# Patient Record
Sex: Female | Born: 1950 | Race: White | Hispanic: No | Marital: Married | Smoking: Never smoker
Health system: Southern US, Community
[De-identification: ages and names within clinical notes are randomized; demographics above are authoritative.]

## PROBLEM LIST (undated history)

## (undated) DIAGNOSIS — I10 Essential (primary) hypertension: Secondary | ICD-10-CM

## (undated) DIAGNOSIS — E119 Type 2 diabetes mellitus without complications: Secondary | ICD-10-CM

## (undated) DIAGNOSIS — M1990 Unspecified osteoarthritis, unspecified site: Secondary | ICD-10-CM

## (undated) HISTORY — DX: Type 2 diabetes mellitus without complications: E11.9

## (undated) HISTORY — DX: Unspecified osteoarthritis, unspecified site: M19.90

## (undated) HISTORY — PX: APPENDECTOMY: SHX54

## (undated) HISTORY — DX: Essential (primary) hypertension: I10

---

## 1999-07-06 ENCOUNTER — Encounter: Admission: RE | Admit: 1999-07-06 | Discharge: 1999-07-06 | Payer: Self-pay | Admitting: Emergency Medicine

## 1999-07-06 ENCOUNTER — Encounter: Payer: Self-pay | Admitting: Emergency Medicine

## 1999-12-14 ENCOUNTER — Other Ambulatory Visit: Admission: RE | Admit: 1999-12-14 | Discharge: 1999-12-14 | Payer: Self-pay | Admitting: Emergency Medicine

## 2001-09-15 ENCOUNTER — Encounter: Admission: RE | Admit: 2001-09-15 | Discharge: 2001-09-15 | Payer: Self-pay | Admitting: Emergency Medicine

## 2001-09-15 ENCOUNTER — Encounter: Payer: Self-pay | Admitting: Emergency Medicine

## 2001-10-19 ENCOUNTER — Encounter: Payer: Self-pay | Admitting: Emergency Medicine

## 2001-10-19 ENCOUNTER — Encounter: Admission: RE | Admit: 2001-10-19 | Discharge: 2001-10-19 | Payer: Self-pay | Admitting: Emergency Medicine

## 2001-12-08 ENCOUNTER — Encounter: Payer: Self-pay | Admitting: Emergency Medicine

## 2001-12-08 ENCOUNTER — Encounter: Admission: RE | Admit: 2001-12-08 | Discharge: 2001-12-08 | Payer: Self-pay | Admitting: Emergency Medicine

## 2002-02-26 ENCOUNTER — Encounter: Admission: RE | Admit: 2002-02-26 | Discharge: 2002-02-26 | Payer: Self-pay | Admitting: Emergency Medicine

## 2002-02-26 ENCOUNTER — Encounter: Payer: Self-pay | Admitting: Emergency Medicine

## 2004-02-26 ENCOUNTER — Encounter: Admission: RE | Admit: 2004-02-26 | Discharge: 2004-02-26 | Payer: Self-pay | Admitting: Emergency Medicine

## 2004-10-05 ENCOUNTER — Encounter: Admission: RE | Admit: 2004-10-05 | Discharge: 2004-10-05 | Payer: Self-pay | Admitting: Emergency Medicine

## 2004-10-05 ENCOUNTER — Inpatient Hospital Stay (HOSPITAL_COMMUNITY): Admission: EM | Admit: 2004-10-05 | Discharge: 2004-10-08 | Payer: Self-pay | Admitting: Emergency Medicine

## 2006-04-04 ENCOUNTER — Encounter: Admission: RE | Admit: 2006-04-04 | Discharge: 2006-04-04 | Payer: Self-pay | Admitting: Emergency Medicine

## 2010-03-21 ENCOUNTER — Encounter: Payer: Self-pay | Admitting: Emergency Medicine

## 2016-04-05 DIAGNOSIS — H5201 Hypermetropia, right eye: Secondary | ICD-10-CM | POA: Diagnosis not present

## 2016-04-05 DIAGNOSIS — H524 Presbyopia: Secondary | ICD-10-CM | POA: Diagnosis not present

## 2016-04-05 DIAGNOSIS — H52223 Regular astigmatism, bilateral: Secondary | ICD-10-CM | POA: Diagnosis not present

## 2016-04-05 DIAGNOSIS — H5212 Myopia, left eye: Secondary | ICD-10-CM | POA: Diagnosis not present

## 2016-04-06 DIAGNOSIS — E782 Mixed hyperlipidemia: Secondary | ICD-10-CM | POA: Diagnosis not present

## 2016-04-06 DIAGNOSIS — E119 Type 2 diabetes mellitus without complications: Secondary | ICD-10-CM | POA: Diagnosis not present

## 2016-04-09 DIAGNOSIS — I10 Essential (primary) hypertension: Secondary | ICD-10-CM | POA: Diagnosis not present

## 2016-04-09 DIAGNOSIS — E782 Mixed hyperlipidemia: Secondary | ICD-10-CM | POA: Diagnosis not present

## 2016-04-09 DIAGNOSIS — E119 Type 2 diabetes mellitus without complications: Secondary | ICD-10-CM | POA: Diagnosis not present

## 2016-04-09 DIAGNOSIS — Z6823 Body mass index (BMI) 23.0-23.9, adult: Secondary | ICD-10-CM | POA: Diagnosis not present

## 2016-06-07 DIAGNOSIS — Z6823 Body mass index (BMI) 23.0-23.9, adult: Secondary | ICD-10-CM | POA: Diagnosis not present

## 2016-06-07 DIAGNOSIS — E782 Mixed hyperlipidemia: Secondary | ICD-10-CM | POA: Diagnosis not present

## 2016-06-07 DIAGNOSIS — I10 Essential (primary) hypertension: Secondary | ICD-10-CM | POA: Diagnosis not present

## 2016-06-07 DIAGNOSIS — E119 Type 2 diabetes mellitus without complications: Secondary | ICD-10-CM | POA: Diagnosis not present

## 2016-07-05 DIAGNOSIS — E119 Type 2 diabetes mellitus without complications: Secondary | ICD-10-CM | POA: Diagnosis not present

## 2016-07-05 DIAGNOSIS — E782 Mixed hyperlipidemia: Secondary | ICD-10-CM | POA: Diagnosis not present

## 2016-07-05 DIAGNOSIS — I10 Essential (primary) hypertension: Secondary | ICD-10-CM | POA: Diagnosis not present

## 2016-07-07 DIAGNOSIS — Z23 Encounter for immunization: Secondary | ICD-10-CM | POA: Diagnosis not present

## 2016-07-07 DIAGNOSIS — E782 Mixed hyperlipidemia: Secondary | ICD-10-CM | POA: Diagnosis not present

## 2016-07-07 DIAGNOSIS — Z6823 Body mass index (BMI) 23.0-23.9, adult: Secondary | ICD-10-CM | POA: Diagnosis not present

## 2016-07-07 DIAGNOSIS — I10 Essential (primary) hypertension: Secondary | ICD-10-CM | POA: Diagnosis not present

## 2016-07-07 DIAGNOSIS — E119 Type 2 diabetes mellitus without complications: Secondary | ICD-10-CM | POA: Diagnosis not present

## 2016-10-06 DIAGNOSIS — E119 Type 2 diabetes mellitus without complications: Secondary | ICD-10-CM | POA: Diagnosis not present

## 2016-10-06 DIAGNOSIS — I10 Essential (primary) hypertension: Secondary | ICD-10-CM | POA: Diagnosis not present

## 2016-10-13 DIAGNOSIS — E119 Type 2 diabetes mellitus without complications: Secondary | ICD-10-CM | POA: Diagnosis not present

## 2016-10-13 DIAGNOSIS — I10 Essential (primary) hypertension: Secondary | ICD-10-CM | POA: Diagnosis not present

## 2016-10-13 DIAGNOSIS — Z6823 Body mass index (BMI) 23.0-23.9, adult: Secondary | ICD-10-CM | POA: Diagnosis not present

## 2016-10-13 DIAGNOSIS — E782 Mixed hyperlipidemia: Secondary | ICD-10-CM | POA: Diagnosis not present

## 2017-01-12 DIAGNOSIS — I10 Essential (primary) hypertension: Secondary | ICD-10-CM | POA: Diagnosis not present

## 2017-01-12 DIAGNOSIS — E119 Type 2 diabetes mellitus without complications: Secondary | ICD-10-CM | POA: Diagnosis not present

## 2017-01-12 DIAGNOSIS — E782 Mixed hyperlipidemia: Secondary | ICD-10-CM | POA: Diagnosis not present

## 2017-01-19 DIAGNOSIS — I10 Essential (primary) hypertension: Secondary | ICD-10-CM | POA: Diagnosis not present

## 2017-04-06 DIAGNOSIS — E119 Type 2 diabetes mellitus without complications: Secondary | ICD-10-CM | POA: Diagnosis not present

## 2017-04-11 DIAGNOSIS — E782 Mixed hyperlipidemia: Secondary | ICD-10-CM | POA: Diagnosis not present

## 2017-04-11 DIAGNOSIS — E1165 Type 2 diabetes mellitus with hyperglycemia: Secondary | ICD-10-CM | POA: Diagnosis not present

## 2017-04-11 DIAGNOSIS — I10 Essential (primary) hypertension: Secondary | ICD-10-CM | POA: Diagnosis not present

## 2017-04-13 DIAGNOSIS — I10 Essential (primary) hypertension: Secondary | ICD-10-CM | POA: Diagnosis not present

## 2017-07-04 DIAGNOSIS — E1165 Type 2 diabetes mellitus with hyperglycemia: Secondary | ICD-10-CM | POA: Diagnosis not present

## 2017-07-04 DIAGNOSIS — E782 Mixed hyperlipidemia: Secondary | ICD-10-CM | POA: Diagnosis not present

## 2017-07-06 DIAGNOSIS — I10 Essential (primary) hypertension: Secondary | ICD-10-CM | POA: Diagnosis not present

## 2017-07-06 DIAGNOSIS — E1165 Type 2 diabetes mellitus with hyperglycemia: Secondary | ICD-10-CM | POA: Diagnosis not present

## 2017-07-06 DIAGNOSIS — E782 Mixed hyperlipidemia: Secondary | ICD-10-CM | POA: Diagnosis not present

## 2017-07-06 DIAGNOSIS — M25512 Pain in left shoulder: Secondary | ICD-10-CM | POA: Diagnosis not present

## 2017-07-19 ENCOUNTER — Ambulatory Visit: Payer: Medicare Other | Admitting: Orthopaedic Surgery

## 2017-07-19 ENCOUNTER — Encounter: Payer: Self-pay | Admitting: Orthopaedic Surgery

## 2017-07-19 ENCOUNTER — Ambulatory Visit (INDEPENDENT_AMBULATORY_CARE_PROVIDER_SITE_OTHER): Payer: Medicare Other

## 2017-07-19 VITALS — BP 143/69 | HR 84 | Ht 60.0 in | Wt 133.0 lb

## 2017-07-19 DIAGNOSIS — M25512 Pain in left shoulder: Secondary | ICD-10-CM | POA: Diagnosis not present

## 2017-07-19 DIAGNOSIS — G5601 Carpal tunnel syndrome, right upper limb: Secondary | ICD-10-CM | POA: Diagnosis not present

## 2017-07-19 NOTE — Patient Instructions (Signed)
Shoulder Exercises Ask your health care provider which exercises are safe for you. Do exercises exactly as told by your health care provider and adjust them as directed. It is normal to feel mild stretching, pulling, tightness, or discomfort as you do these exercises, but you should stop right away if you feel sudden pain or your pain gets worse.Do not begin these exercises until told by your health care provider. RANGE OF MOTION EXERCISES These exercises warm up your muscles and joints and improve the movement and flexibility of your shoulder. These exercises also help to relieve pain, numbness, and tingling. These exercises involve stretching your injured shoulder directly. Exercise A: Pendulum  1. Stand near a wall or a surface that you can hold onto for balance. 2. Bend at the waist and let your left / right arm hang straight down. Use your other arm to support you. Keep your back straight and do not lock your knees. 3. Relax your left / right arm and shoulder muscles, and move your hips and your trunk so your left / right arm swings freely. Your arm should swing because of the motion of your body, not because you are using your arm or shoulder muscles. 4. Keep moving your body so your arm swings in the following directions, as told by your health care provider: ? Side to side. ? Forward and backward. ? In clockwise and counterclockwise circles. 5. Continue each motion for __________ seconds, or for as Laurie Lamb as told by your health care provider. 6. Slowly return to the starting position. Repeat __________ times. Complete this exercise __________ times a day. Exercise B:Flexion, Standing  1. Stand and hold a broomstick, a cane, or a similar object. Place your hands a little more than shoulder-width apart on the object. Your left / right hand should be palm-up, and your other hand should be palm-down. 2. Keep your elbow straight and keep your shoulder muscles relaxed. Push the stick down with  your healthy arm to raise your left / right arm in front of your body, and then over your head until you feel a stretch in your shoulder. ? Avoid shrugging your shoulder while you raise your arm. Keep your shoulder blade tucked down toward the middle of your back. 3. Hold for __________ seconds. 4. Slowly return to the starting position. Repeat __________ times. Complete this exercise __________ times a day. Exercise C: Abduction, Standing 1. Stand and hold a broomstick, a cane, or a similar object. Place your hands a little more than shoulder-width apart on the object. Your left / right hand should be palm-up, and your other hand should be palm-down. 2. While keeping your elbow straight and your shoulder muscles relaxed, push the stick across your body toward your left / right side. Raise your left / right arm to the side of your body and then over your head until you feel a stretch in your shoulder. ? Do not raise your arm above shoulder height, unless your health care provider tells you to do that. ? Avoid shrugging your shoulder while you raise your arm. Keep your shoulder blade tucked down toward the middle of your back. 3. Hold for __________ seconds. 4. Slowly return to the starting position. Repeat __________ times. Complete this exercise __________ times a day. Exercise D:Internal Rotation  1. Place your left / right hand behind your back, palm-up. 2. Use your other hand to dangle an exercise band, a towel, or a similar object over your shoulder. Grasp the band with   your left / right hand so you are holding onto both ends. 3. Gently pull up on the band until you feel a stretch in the front of your left / right shoulder. ? Avoid shrugging your shoulder while you raise your arm. Keep your shoulder blade tucked down toward the middle of your back. 4. Hold for __________ seconds. 5. Release the stretch by letting go of the band and lowering your hands. Repeat __________ times. Complete  this exercise __________ times a day. STRETCHING EXERCISES These exercises warm up your muscles and joints and improve the movement and flexibility of your shoulder. These exercises also help to relieve pain, numbness, and tingling. These exercises are done using your healthy shoulder to help stretch the muscles of your injured shoulder. Exercise E: Corner Stretch (External Rotation and Abduction)  1. Stand in a doorway with one of your feet slightly in front of the other. This is called a staggered stance. If you cannot reach your forearms to the door frame, stand facing a corner of a room. 2. Choose one of the following positions as told by your health care provider: ? Place your hands and forearms on the door frame above your head. ? Place your hands and forearms on the door frame at the height of your head. ? Place your hands on the door frame at the height of your elbows. 3. Slowly move your weight onto your front foot until you feel a stretch across your chest and in the front of your shoulders. Keep your head and chest upright and keep your abdominal muscles tight. 4. Hold for __________ seconds. 5. To release the stretch, shift your weight to your back foot. Repeat __________ times. Complete this stretch __________ times a day. Exercise F:Extension, Standing 1. Stand and hold a broomstick, a cane, or a similar object behind your back. ? Your hands should be a little wider than shoulder-width apart. ? Your palms should face away from your back. 2. Keeping your elbows straight and keeping your shoulder muscles relaxed, move the stick away from your body until you feel a stretch in your shoulder. ? Avoid shrugging your shoulders while you move the stick. Keep your shoulder blade tucked down toward the middle of your back. 3. Hold for __________ seconds. 4. Slowly return to the starting position. Repeat __________ times. Complete this exercise __________ times a day. STRENGTHENING  EXERCISES These exercises build strength and endurance in your shoulder. Endurance is the ability to use your muscles for a Laurie Lamb time, even after they get tired. Exercise G:External Rotation  1. Sit in a stable chair without armrests. 2. Secure an exercise band at elbow height on your left / right side. 3. Place a soft object, such as a folded towel or a small pillow, between your left / right upper arm and your body to move your elbow a few inches away (about 10 cm) from your side. 4. Hold the end of the band so it is tight and there is no slack. 5. Keeping your elbow pressed against the soft object, move your left / right forearm out, away from your abdomen. Keep your body steady so only your forearm moves. 6. Hold for __________ seconds. 7. Slowly return to the starting position. Repeat __________ times. Complete this exercise __________ times a day. Exercise H:Shoulder Abduction  1. Sit in a stable chair without armrests, or stand. 2. Hold a __________ weight in your left / right hand, or hold an exercise band with both hands.   3. Start with your arms straight down and your left / right palm facing in, toward your body. 4. Slowly lift your left / right hand out to your side. Do not lift your hand above shoulder height unless your health care provider tells you that this is safe. ? Keep your arms straight. ? Avoid shrugging your shoulder while you do this movement. Keep your shoulder blade tucked down toward the middle of your back. 5. Hold for __________ seconds. 6. Slowly lower your arm, and return to the starting position. Repeat __________ times. Complete this exercise __________ times a day. Exercise I:Shoulder Extension 1. Sit in a stable chair without armrests, or stand. 2. Secure an exercise band to a stable object in front of you where it is at shoulder height. 3. Hold one end of the exercise band in each hand. Your palms should face each other. 4. Straighten your elbows and  lift your hands up to shoulder height. 5. Step back, away from the secured end of the exercise band, until the band is tight and there is no slack. 6. Squeeze your shoulder blades together as you pull your hands down to the sides of your thighs. Stop when your hands are straight down by your sides. Do not let your hands go behind your body. 7. Hold for __________ seconds. 8. Slowly return to the starting position. Repeat __________ times. Complete this exercise __________ times a day. Exercise J:Standing Shoulder Row 1. Sit in a stable chair without armrests, or stand. 2. Secure an exercise band to a stable object in front of you so it is at waist height. 3. Hold one end of the exercise band in each hand. Your palms should be in a thumbs-up position. 4. Bend each of your elbows to an "L" shape (about 90 degrees) and keep your upper arms at your sides. 5. Step back until the band is tight and there is no slack. 6. Slowly pull your elbows back behind you. 7. Hold for __________ seconds. 8. Slowly return to the starting position. Repeat __________ times. Complete this exercise __________ times a day. Exercise K:Shoulder Press-Ups  1. Sit in a stable chair that has armrests. Sit upright, with your feet flat on the floor. 2. Put your hands on the armrests so your elbows are bent and your fingers are pointing forward. Your hands should be about even with the sides of your body. 3. Push down on the armrests and use your arms to lift yourself off of the chair. Straighten your elbows and lift yourself up as much as you comfortably can. ? Move your shoulder blades down, and avoid letting your shoulders move up toward your ears. ? Keep your feet on the ground. As you get stronger, your feet should support less of your body weight as you lift yourself up. 4. Hold for __________ seconds. 5. Slowly lower yourself back into the chair. Repeat __________ times. Complete this exercise __________ times a  day. Exercise L: Wall Push-Ups  1. Stand so you are facing a stable wall. Your feet should be about one arm-length away from the wall. 2. Lean forward and place your palms on the wall at shoulder height. 3. Keep your feet flat on the floor as you bend your elbows and lean forward toward the wall. 4. Hold for __________ seconds. 5. Straighten your elbows to push yourself back to the starting position. Repeat __________ times. Complete this exercise __________ times a day. This information is not intended to replace advice   given to you by your health care provider. Make sure you discuss any questions you have with your health care provider. Document Released: 12/30/2004 Document Revised: 11/10/2015 Document Reviewed: 10/27/2014 Elsevier Interactive Patient Education  2018 [employer]  

## 2017-07-19 NOTE — Progress Notes (Signed)
Subjective: My left shoulder hurts    Patient ID: Laurie Lamb, female    DOB: 1950/05/25, 67 y.o.   MRN: [id number]  HPI She has about a four month history of increasing pain of the left shoulder. She has no falls or trauma.  She has no redness.  She has noticed it is more difficult to comb her hair or move her arm above her head. She has no numbness of the left hand but has some nocturnal pain of the right hand. She has no neck pain.  She has taken one Advil a day for this with only minimal results.  She has tried heat and ice and rubs with no help.  She saw Dr. Margo Aye for this and I have read his notes.   Review of Systems  Respiratory: Negative for cough and shortness of breath.   Cardiovascular: Negative for chest pain and leg swelling.  Musculoskeletal: Positive for arthralgias.  All other systems reviewed and are negative.  Past Medical History:  Diagnosis Date  . Arthritis   . Diabetes mellitus without complication (HCC)   . Hypertension     Past Surgical History:  Procedure Laterality Date  . APPENDECTOMY    . CESAREAN SECTION      Current Outpatient Medications on File Prior to Visit  Medication Sig Dispense Refill  . amLODipine (NORVASC) 5 MG tablet Take 5 mg by mouth daily.    . metFORMIN (GLUCOPHAGE) 500 MG tablet Take by mouth 2 (two) times daily with a meal.    . valsartan-hydrochlorothiazide (DIOVAN-HCT) 160-12.5 MG tablet Take 1 tablet by mouth daily.     No current facility-administered medications on file prior to visit.     Social History   Socioeconomic History  . Marital status: Married    Spouse name: Not on file  . Number of children: Not on file  . Years of education: Not on file  . Highest education level: Not on file  Occupational History  . Not on file  Social Needs  . Financial resource strain: Not on file  . Food insecurity:    Worry: Not on file    Inability: Not on file  . Transportation needs:    Medical: Not on file   Non-medical: Not on file  Tobacco Use  . Smoking status: Never Smoker  . Smokeless tobacco: Never Used  Substance and Sexual Activity  . Alcohol use: Never    Frequency: Never  . Drug use: Never  . Sexual activity: Not on file  Lifestyle  . Physical activity:    Days per week: Not on file    Minutes per session: Not on file  . Stress: Not on file  Relationships  . Social connections:    Talks on phone: Not on file    Gets together: Not on file    Attends religious service: Not on file    Active member of club or organization: Not on file    Attends meetings of clubs or organizations: Not on file    Relationship status: Not on file  . Intimate partner violence:    Fear of current or ex partner: Not on file    Emotionally abused: Not on file    Physically abused: Not on file    Forced sexual activity: Not on file  Other Topics Concern  . Not on file  Social History Narrative  . Not on file    Family History  Problem Relation Age of Onset  .  Thyroid disease Sister   . Diabetes Sister   . Graves' disease Sister   . Cancer Brother     BP (!) 143/69   Pulse 84   Ht 5' (1.524 m)   Wt 133 lb (60.3 kg)   BMI 25.97 kg/m        Objective:   Physical Exam  Constitutional: She is oriented to person, place, and time. She appears well-developed and well-nourished.  HENT:  Head: Normocephalic and atraumatic.  Eyes: Pupils are equal, round, and reactive to light. Conjunctivae and EOM are normal.  Neck: Normal range of motion. Neck supple.  Cardiovascular: Normal rate, regular rhythm and intact distal pulses.  Pulmonary/Chest: Effort normal.  Abdominal: Soft.  Musculoskeletal:       Left shoulder: She exhibits decreased range of motion and tenderness.       Arms: Neurological: She is alert and oriented to person, place, and time. She has normal reflexes. She displays normal reflexes. No cranial nerve deficit. She exhibits normal muscle tone. Coordination normal.  Skin:  Skin is warm and dry.  Psychiatric: She has a normal mood and affect. Her behavior is normal. Judgment and thought content normal.  She has weakly positive Phalen on the right and negative Tinel on the right hand,  Both are negative on the left hand.  X-rays of the shoulder on the left was done, reported separately.      Assessment & Plan:   Encounter Diagnoses  Name Primary?  . Pain in joint of left shoulder Yes  . Carpal tunnel syndrome on right    PROCEDURE NOTE:  The patient request injection, verbal consent was obtained.  The left shoulder was prepped appropriately after time out was performed.   Sterile technique was observed and injection of 1 cc of Depo-Medrol 40 mg with several cc's of plain xylocaine. Anesthesia was provided by ethyl chloride and a 20-gauge needle was used to inject the shoulder area. A posterior approach was used.  The injection was tolerated well.  A band aid dressing was applied.  The patient was advised to apply ice later today and tomorrow to the injection sight as needed.  I want her to take two Advil twice a day by mouth.  Return in one month.  Call if any problem.  Precautions discussed.   Electronically Signed Darreld Mclean, MD 5/21/20199:55 AM

## 2017-08-23 ENCOUNTER — Encounter: Payer: Self-pay | Admitting: Orthopaedic Surgery

## 2017-08-23 ENCOUNTER — Ambulatory Visit: Payer: Medicare Other | Admitting: Orthopaedic Surgery

## 2017-08-23 VITALS — BP 134/68 | HR 85 | Ht 60.0 in | Wt 135.0 lb

## 2017-08-23 DIAGNOSIS — M25512 Pain in left shoulder: Secondary | ICD-10-CM

## 2017-08-23 NOTE — Progress Notes (Signed)
CC:  My shoulder is better  She has full motion of the left shoulder today and no pain. The injection last time helped significantly.  NV intact. ROM is full.  Encounter Diagnosis  Name Primary?  . Pain in joint of left shoulder Yes   I will see as needed.  Electronically Signed Darreld McleanWayne Joseline Mccampbell, MD 6/25/20198:28 AM

## 2017-10-06 DIAGNOSIS — E1165 Type 2 diabetes mellitus with hyperglycemia: Secondary | ICD-10-CM | POA: Diagnosis not present

## 2017-10-06 DIAGNOSIS — I10 Essential (primary) hypertension: Secondary | ICD-10-CM | POA: Diagnosis not present

## 2017-10-06 DIAGNOSIS — E782 Mixed hyperlipidemia: Secondary | ICD-10-CM | POA: Diagnosis not present

## 2017-10-11 DIAGNOSIS — I10 Essential (primary) hypertension: Secondary | ICD-10-CM | POA: Diagnosis not present

## 2017-10-11 DIAGNOSIS — E1165 Type 2 diabetes mellitus with hyperglycemia: Secondary | ICD-10-CM | POA: Diagnosis not present

## 2017-10-11 DIAGNOSIS — E782 Mixed hyperlipidemia: Secondary | ICD-10-CM | POA: Diagnosis not present

## 2017-11-22 DIAGNOSIS — Z0000 Encounter for general adult medical examination without abnormal findings: Secondary | ICD-10-CM | POA: Diagnosis not present

## 2018-01-16 DIAGNOSIS — I10 Essential (primary) hypertension: Secondary | ICD-10-CM | POA: Diagnosis not present

## 2018-01-16 DIAGNOSIS — E782 Mixed hyperlipidemia: Secondary | ICD-10-CM | POA: Diagnosis not present

## 2018-01-16 DIAGNOSIS — Z0000 Encounter for general adult medical examination without abnormal findings: Secondary | ICD-10-CM | POA: Diagnosis not present

## 2018-01-16 DIAGNOSIS — E1165 Type 2 diabetes mellitus with hyperglycemia: Secondary | ICD-10-CM | POA: Diagnosis not present

## 2018-01-25 DIAGNOSIS — E0840 Diabetes mellitus due to underlying condition with diabetic neuropathy, unspecified: Secondary | ICD-10-CM | POA: Diagnosis not present

## 2018-01-25 DIAGNOSIS — I10 Essential (primary) hypertension: Secondary | ICD-10-CM | POA: Diagnosis not present

## 2018-01-25 DIAGNOSIS — G9009 Other idiopathic peripheral autonomic neuropathy: Secondary | ICD-10-CM | POA: Diagnosis not present

## 2018-01-25 DIAGNOSIS — E782 Mixed hyperlipidemia: Secondary | ICD-10-CM | POA: Diagnosis not present

## 2018-05-23 DIAGNOSIS — E782 Mixed hyperlipidemia: Secondary | ICD-10-CM | POA: Diagnosis not present

## 2018-05-23 DIAGNOSIS — Z0000 Encounter for general adult medical examination without abnormal findings: Secondary | ICD-10-CM | POA: Diagnosis not present

## 2018-05-23 DIAGNOSIS — E1165 Type 2 diabetes mellitus with hyperglycemia: Secondary | ICD-10-CM | POA: Diagnosis not present

## 2018-05-23 DIAGNOSIS — I10 Essential (primary) hypertension: Secondary | ICD-10-CM | POA: Diagnosis not present

## 2018-05-26 DIAGNOSIS — E782 Mixed hyperlipidemia: Secondary | ICD-10-CM | POA: Diagnosis not present

## 2018-05-26 DIAGNOSIS — M791 Myalgia, unspecified site: Secondary | ICD-10-CM | POA: Diagnosis not present

## 2018-05-26 DIAGNOSIS — I10 Essential (primary) hypertension: Secondary | ICD-10-CM | POA: Diagnosis not present

## 2018-05-26 DIAGNOSIS — E0840 Diabetes mellitus due to underlying condition with diabetic neuropathy, unspecified: Secondary | ICD-10-CM | POA: Diagnosis not present

## 2018-05-26 DIAGNOSIS — G9009 Other idiopathic peripheral autonomic neuropathy: Secondary | ICD-10-CM | POA: Diagnosis not present

## 2018-07-04 DIAGNOSIS — Z0000 Encounter for general adult medical examination without abnormal findings: Secondary | ICD-10-CM | POA: Diagnosis not present

## 2018-08-10 DIAGNOSIS — Z1211 Encounter for screening for malignant neoplasm of colon: Secondary | ICD-10-CM | POA: Diagnosis not present

## 2018-08-10 DIAGNOSIS — Z1212 Encounter for screening for malignant neoplasm of rectum: Secondary | ICD-10-CM | POA: Diagnosis not present

## 2018-08-29 DIAGNOSIS — Z712 Person consulting for explanation of examination or test findings: Secondary | ICD-10-CM | POA: Diagnosis not present

## 2018-08-29 DIAGNOSIS — E782 Mixed hyperlipidemia: Secondary | ICD-10-CM | POA: Diagnosis not present

## 2018-08-29 DIAGNOSIS — E1165 Type 2 diabetes mellitus with hyperglycemia: Secondary | ICD-10-CM | POA: Diagnosis not present

## 2018-08-29 DIAGNOSIS — Z0000 Encounter for general adult medical examination without abnormal findings: Secondary | ICD-10-CM | POA: Diagnosis not present

## 2018-08-29 DIAGNOSIS — I10 Essential (primary) hypertension: Secondary | ICD-10-CM | POA: Diagnosis not present

## 2018-09-05 DIAGNOSIS — M791 Myalgia, unspecified site: Secondary | ICD-10-CM | POA: Diagnosis not present

## 2018-09-05 DIAGNOSIS — I10 Essential (primary) hypertension: Secondary | ICD-10-CM | POA: Diagnosis not present

## 2018-09-05 DIAGNOSIS — G9009 Other idiopathic peripheral autonomic neuropathy: Secondary | ICD-10-CM | POA: Diagnosis not present

## 2018-09-05 DIAGNOSIS — E1140 Type 2 diabetes mellitus with diabetic neuropathy, unspecified: Secondary | ICD-10-CM | POA: Diagnosis not present

## 2018-09-05 DIAGNOSIS — E782 Mixed hyperlipidemia: Secondary | ICD-10-CM | POA: Diagnosis not present

## 2018-09-06 DIAGNOSIS — E119 Type 2 diabetes mellitus without complications: Secondary | ICD-10-CM | POA: Diagnosis not present

## 2018-09-08 DIAGNOSIS — E782 Mixed hyperlipidemia: Secondary | ICD-10-CM | POA: Diagnosis not present

## 2018-09-08 DIAGNOSIS — E1140 Type 2 diabetes mellitus with diabetic neuropathy, unspecified: Secondary | ICD-10-CM | POA: Diagnosis not present

## 2018-09-08 DIAGNOSIS — E0840 Diabetes mellitus due to underlying condition with diabetic neuropathy, unspecified: Secondary | ICD-10-CM | POA: Diagnosis not present

## 2018-09-08 DIAGNOSIS — I10 Essential (primary) hypertension: Secondary | ICD-10-CM | POA: Diagnosis not present

## 2018-09-08 DIAGNOSIS — E1165 Type 2 diabetes mellitus with hyperglycemia: Secondary | ICD-10-CM | POA: Diagnosis not present

## 2018-09-14 DIAGNOSIS — B351 Tinea unguium: Secondary | ICD-10-CM | POA: Diagnosis not present

## 2018-10-09 DIAGNOSIS — R112 Nausea with vomiting, unspecified: Secondary | ICD-10-CM | POA: Diagnosis not present

## 2018-10-09 DIAGNOSIS — R319 Hematuria, unspecified: Secondary | ICD-10-CM | POA: Diagnosis not present

## 2018-10-09 DIAGNOSIS — R52 Pain, unspecified: Secondary | ICD-10-CM | POA: Diagnosis not present

## 2018-10-12 DIAGNOSIS — E0840 Diabetes mellitus due to underlying condition with diabetic neuropathy, unspecified: Secondary | ICD-10-CM | POA: Diagnosis not present

## 2018-10-12 DIAGNOSIS — E782 Mixed hyperlipidemia: Secondary | ICD-10-CM | POA: Diagnosis not present

## 2018-10-12 DIAGNOSIS — I10 Essential (primary) hypertension: Secondary | ICD-10-CM | POA: Diagnosis not present

## 2018-10-12 DIAGNOSIS — E1140 Type 2 diabetes mellitus with diabetic neuropathy, unspecified: Secondary | ICD-10-CM | POA: Diagnosis not present

## 2018-10-12 DIAGNOSIS — E1165 Type 2 diabetes mellitus with hyperglycemia: Secondary | ICD-10-CM | POA: Diagnosis not present

## 2018-10-17 DIAGNOSIS — B351 Tinea unguium: Secondary | ICD-10-CM | POA: Diagnosis not present

## 2018-10-20 DIAGNOSIS — M10071 Idiopathic gout, right ankle and foot: Secondary | ICD-10-CM | POA: Diagnosis not present

## 2018-10-27 DIAGNOSIS — E1165 Type 2 diabetes mellitus with hyperglycemia: Secondary | ICD-10-CM | POA: Diagnosis not present

## 2018-10-27 DIAGNOSIS — E782 Mixed hyperlipidemia: Secondary | ICD-10-CM | POA: Diagnosis not present

## 2018-10-27 DIAGNOSIS — E0840 Diabetes mellitus due to underlying condition with diabetic neuropathy, unspecified: Secondary | ICD-10-CM | POA: Diagnosis not present

## 2018-10-27 DIAGNOSIS — E1140 Type 2 diabetes mellitus with diabetic neuropathy, unspecified: Secondary | ICD-10-CM | POA: Diagnosis not present

## 2018-10-27 DIAGNOSIS — I10 Essential (primary) hypertension: Secondary | ICD-10-CM | POA: Diagnosis not present

## 2018-11-07 DIAGNOSIS — E1165 Type 2 diabetes mellitus with hyperglycemia: Secondary | ICD-10-CM | POA: Diagnosis not present

## 2018-11-07 DIAGNOSIS — E0840 Diabetes mellitus due to underlying condition with diabetic neuropathy, unspecified: Secondary | ICD-10-CM | POA: Diagnosis not present

## 2018-11-07 DIAGNOSIS — E782 Mixed hyperlipidemia: Secondary | ICD-10-CM | POA: Diagnosis not present

## 2018-11-07 DIAGNOSIS — I10 Essential (primary) hypertension: Secondary | ICD-10-CM | POA: Diagnosis not present

## 2018-11-07 DIAGNOSIS — E1140 Type 2 diabetes mellitus with diabetic neuropathy, unspecified: Secondary | ICD-10-CM | POA: Diagnosis not present

## 2018-12-07 ENCOUNTER — Other Ambulatory Visit: Payer: Self-pay

## 2018-12-07 NOTE — Patient Outreach (Signed)
[facility]) Care Management  12/07/2018  Laurie Lamb 1950/12/20 [id number]   Medication Adherence call to [name] [facility] Identifiers Verify spoke with patient she is past due on Rosuvastatin 5 mg she explain she is no longer taking this medication because she was having side effects.[name] is showing past due under [place].   [place] Management Direct Dial [phone]  Fax [phone] [email or web address]

## 2018-12-21 ENCOUNTER — Other Ambulatory Visit: Payer: Self-pay

## 2018-12-21 DIAGNOSIS — Z20822 Contact with and (suspected) exposure to covid-19: Secondary | ICD-10-CM

## 2018-12-23 LAB — NOVEL CORONAVIRUS, NAA: SARS-CoV-2, NAA: NOT DETECTED

## 2018-12-25 ENCOUNTER — Telehealth: Payer: Self-pay | Admitting: Internal Medicine

## 2018-12-25 DIAGNOSIS — E0840 Diabetes mellitus due to underlying condition with diabetic neuropathy, unspecified: Secondary | ICD-10-CM | POA: Diagnosis not present

## 2018-12-25 DIAGNOSIS — E1165 Type 2 diabetes mellitus with hyperglycemia: Secondary | ICD-10-CM | POA: Diagnosis not present

## 2018-12-25 DIAGNOSIS — I10 Essential (primary) hypertension: Secondary | ICD-10-CM | POA: Diagnosis not present

## 2018-12-25 DIAGNOSIS — E1140 Type 2 diabetes mellitus with diabetic neuropathy, unspecified: Secondary | ICD-10-CM | POA: Diagnosis not present

## 2018-12-25 DIAGNOSIS — E782 Mixed hyperlipidemia: Secondary | ICD-10-CM | POA: Diagnosis not present

## 2018-12-25 NOTE — Telephone Encounter (Signed)
Negative COVID results given. Patient results "NOT Detected." Caller expressed understanding. ° °

## 2019-01-03 ENCOUNTER — Other Ambulatory Visit: Payer: Self-pay

## 2019-01-03 NOTE — Patient Outreach (Signed)
[facility]) Care Management  01/03/2019  Laurie Lamb December 27, 1950 [id number]   Medication Adherence call to Laurie Lamb [facility] Identifiers Verify spoke with patient she is past due on Rosuvastatin 5 mg and Pioglitazone 15 mg,patient explain she is no longer taking both medication she was having side effects.patient will go over with her doctor on her upcoming appointment.Laurie Lamb is showing past due under [place].   [facility] [place] Management Direct Dial [phone]  Fax [phone] [name]

## 2019-01-08 DIAGNOSIS — H35363 Drusen (degenerative) of macula, bilateral: Secondary | ICD-10-CM | POA: Diagnosis not present

## 2019-01-08 DIAGNOSIS — E0840 Diabetes mellitus due to underlying condition with diabetic neuropathy, unspecified: Secondary | ICD-10-CM | POA: Diagnosis not present

## 2019-01-08 DIAGNOSIS — E119 Type 2 diabetes mellitus without complications: Secondary | ICD-10-CM | POA: Diagnosis not present

## 2019-01-08 DIAGNOSIS — E1140 Type 2 diabetes mellitus with diabetic neuropathy, unspecified: Secondary | ICD-10-CM | POA: Diagnosis not present

## 2019-01-08 DIAGNOSIS — I10 Essential (primary) hypertension: Secondary | ICD-10-CM | POA: Diagnosis not present

## 2019-01-08 DIAGNOSIS — E1165 Type 2 diabetes mellitus with hyperglycemia: Secondary | ICD-10-CM | POA: Diagnosis not present

## 2019-01-08 DIAGNOSIS — H25813 Combined forms of age-related cataract, bilateral: Secondary | ICD-10-CM | POA: Diagnosis not present

## 2019-01-08 DIAGNOSIS — E782 Mixed hyperlipidemia: Secondary | ICD-10-CM | POA: Diagnosis not present

## 2019-01-08 DIAGNOSIS — H5213 Myopia, bilateral: Secondary | ICD-10-CM | POA: Diagnosis not present

## 2019-01-08 DIAGNOSIS — H524 Presbyopia: Secondary | ICD-10-CM | POA: Diagnosis not present

## 2019-01-11 DIAGNOSIS — G9009 Other idiopathic peripheral autonomic neuropathy: Secondary | ICD-10-CM | POA: Diagnosis not present

## 2019-01-11 DIAGNOSIS — M791 Myalgia, unspecified site: Secondary | ICD-10-CM | POA: Diagnosis not present

## 2019-01-11 DIAGNOSIS — I10 Essential (primary) hypertension: Secondary | ICD-10-CM | POA: Diagnosis not present

## 2019-01-11 DIAGNOSIS — E782 Mixed hyperlipidemia: Secondary | ICD-10-CM | POA: Diagnosis not present

## 2019-01-11 DIAGNOSIS — E1140 Type 2 diabetes mellitus with diabetic neuropathy, unspecified: Secondary | ICD-10-CM | POA: Diagnosis not present

## 2019-01-30 DIAGNOSIS — I10 Essential (primary) hypertension: Secondary | ICD-10-CM | POA: Diagnosis not present

## 2019-01-30 DIAGNOSIS — M791 Myalgia, unspecified site: Secondary | ICD-10-CM | POA: Diagnosis not present

## 2019-01-30 DIAGNOSIS — E782 Mixed hyperlipidemia: Secondary | ICD-10-CM | POA: Diagnosis not present

## 2019-01-30 DIAGNOSIS — E1140 Type 2 diabetes mellitus with diabetic neuropathy, unspecified: Secondary | ICD-10-CM | POA: Diagnosis not present

## 2019-03-20 DIAGNOSIS — E1165 Type 2 diabetes mellitus with hyperglycemia: Secondary | ICD-10-CM | POA: Diagnosis not present

## 2019-03-20 DIAGNOSIS — I10 Essential (primary) hypertension: Secondary | ICD-10-CM | POA: Diagnosis not present

## 2019-03-20 DIAGNOSIS — E0840 Diabetes mellitus due to underlying condition with diabetic neuropathy, unspecified: Secondary | ICD-10-CM | POA: Diagnosis not present

## 2019-03-20 DIAGNOSIS — E1140 Type 2 diabetes mellitus with diabetic neuropathy, unspecified: Secondary | ICD-10-CM | POA: Diagnosis not present

## 2019-03-20 DIAGNOSIS — E782 Mixed hyperlipidemia: Secondary | ICD-10-CM | POA: Diagnosis not present

## 2019-04-03 DIAGNOSIS — E1165 Type 2 diabetes mellitus with hyperglycemia: Secondary | ICD-10-CM | POA: Diagnosis not present

## 2019-04-03 DIAGNOSIS — E0840 Diabetes mellitus due to underlying condition with diabetic neuropathy, unspecified: Secondary | ICD-10-CM | POA: Diagnosis not present

## 2019-04-03 DIAGNOSIS — I10 Essential (primary) hypertension: Secondary | ICD-10-CM | POA: Diagnosis not present

## 2019-04-03 DIAGNOSIS — E782 Mixed hyperlipidemia: Secondary | ICD-10-CM | POA: Diagnosis not present

## 2019-04-03 DIAGNOSIS — E1140 Type 2 diabetes mellitus with diabetic neuropathy, unspecified: Secondary | ICD-10-CM | POA: Diagnosis not present

## 2019-04-23 DIAGNOSIS — E1165 Type 2 diabetes mellitus with hyperglycemia: Secondary | ICD-10-CM | POA: Diagnosis not present

## 2019-04-23 DIAGNOSIS — R1030 Lower abdominal pain, unspecified: Secondary | ICD-10-CM | POA: Diagnosis not present

## 2019-04-23 DIAGNOSIS — E782 Mixed hyperlipidemia: Secondary | ICD-10-CM | POA: Diagnosis not present

## 2019-04-23 DIAGNOSIS — Z712 Person consulting for explanation of examination or test findings: Secondary | ICD-10-CM | POA: Diagnosis not present

## 2019-04-23 DIAGNOSIS — M10071 Idiopathic gout, right ankle and foot: Secondary | ICD-10-CM | POA: Diagnosis not present

## 2019-05-01 DIAGNOSIS — M791 Myalgia, unspecified site: Secondary | ICD-10-CM | POA: Diagnosis not present

## 2019-05-01 DIAGNOSIS — G9009 Other idiopathic peripheral autonomic neuropathy: Secondary | ICD-10-CM | POA: Diagnosis not present

## 2019-05-01 DIAGNOSIS — I10 Essential (primary) hypertension: Secondary | ICD-10-CM | POA: Diagnosis not present

## 2019-05-01 DIAGNOSIS — E782 Mixed hyperlipidemia: Secondary | ICD-10-CM | POA: Diagnosis not present

## 2019-05-01 DIAGNOSIS — E1140 Type 2 diabetes mellitus with diabetic neuropathy, unspecified: Secondary | ICD-10-CM | POA: Diagnosis not present

## 2019-07-12 DIAGNOSIS — E1165 Type 2 diabetes mellitus with hyperglycemia: Secondary | ICD-10-CM | POA: Diagnosis not present

## 2019-07-12 DIAGNOSIS — I10 Essential (primary) hypertension: Secondary | ICD-10-CM | POA: Diagnosis not present

## 2019-07-12 DIAGNOSIS — E0840 Diabetes mellitus due to underlying condition with diabetic neuropathy, unspecified: Secondary | ICD-10-CM | POA: Diagnosis not present

## 2019-07-12 DIAGNOSIS — E782 Mixed hyperlipidemia: Secondary | ICD-10-CM | POA: Diagnosis not present

## 2019-07-12 DIAGNOSIS — E1140 Type 2 diabetes mellitus with diabetic neuropathy, unspecified: Secondary | ICD-10-CM | POA: Diagnosis not present

## 2019-08-08 DIAGNOSIS — E0840 Diabetes mellitus due to underlying condition with diabetic neuropathy, unspecified: Secondary | ICD-10-CM | POA: Diagnosis not present

## 2019-08-08 DIAGNOSIS — E1165 Type 2 diabetes mellitus with hyperglycemia: Secondary | ICD-10-CM | POA: Diagnosis not present

## 2019-08-08 DIAGNOSIS — E782 Mixed hyperlipidemia: Secondary | ICD-10-CM | POA: Diagnosis not present

## 2019-08-08 DIAGNOSIS — E1140 Type 2 diabetes mellitus with diabetic neuropathy, unspecified: Secondary | ICD-10-CM | POA: Diagnosis not present

## 2019-08-08 DIAGNOSIS — B351 Tinea unguium: Secondary | ICD-10-CM | POA: Diagnosis not present

## 2019-08-16 DIAGNOSIS — E1140 Type 2 diabetes mellitus with diabetic neuropathy, unspecified: Secondary | ICD-10-CM | POA: Diagnosis not present

## 2019-08-16 DIAGNOSIS — G9009 Other idiopathic peripheral autonomic neuropathy: Secondary | ICD-10-CM | POA: Diagnosis not present

## 2019-08-16 DIAGNOSIS — I10 Essential (primary) hypertension: Secondary | ICD-10-CM | POA: Diagnosis not present

## 2019-08-16 DIAGNOSIS — M791 Myalgia, unspecified site: Secondary | ICD-10-CM | POA: Diagnosis not present

## 2019-08-16 DIAGNOSIS — E782 Mixed hyperlipidemia: Secondary | ICD-10-CM | POA: Diagnosis not present

## 2019-10-10 DIAGNOSIS — E0840 Diabetes mellitus due to underlying condition with diabetic neuropathy, unspecified: Secondary | ICD-10-CM | POA: Diagnosis not present

## 2019-10-10 DIAGNOSIS — E1140 Type 2 diabetes mellitus with diabetic neuropathy, unspecified: Secondary | ICD-10-CM | POA: Diagnosis not present

## 2019-10-10 DIAGNOSIS — I10 Essential (primary) hypertension: Secondary | ICD-10-CM | POA: Diagnosis not present

## 2019-10-10 DIAGNOSIS — E1165 Type 2 diabetes mellitus with hyperglycemia: Secondary | ICD-10-CM | POA: Diagnosis not present

## 2019-10-10 DIAGNOSIS — E782 Mixed hyperlipidemia: Secondary | ICD-10-CM | POA: Diagnosis not present

## 2019-11-07 DIAGNOSIS — I10 Essential (primary) hypertension: Secondary | ICD-10-CM | POA: Diagnosis not present

## 2019-11-07 DIAGNOSIS — E1140 Type 2 diabetes mellitus with diabetic neuropathy, unspecified: Secondary | ICD-10-CM | POA: Diagnosis not present

## 2019-11-07 DIAGNOSIS — E1165 Type 2 diabetes mellitus with hyperglycemia: Secondary | ICD-10-CM | POA: Diagnosis not present

## 2019-11-07 DIAGNOSIS — E782 Mixed hyperlipidemia: Secondary | ICD-10-CM | POA: Diagnosis not present

## 2019-11-21 DIAGNOSIS — Z712 Person consulting for explanation of examination or test findings: Secondary | ICD-10-CM | POA: Diagnosis not present

## 2019-11-21 DIAGNOSIS — M10071 Idiopathic gout, right ankle and foot: Secondary | ICD-10-CM | POA: Diagnosis not present

## 2019-11-21 DIAGNOSIS — E782 Mixed hyperlipidemia: Secondary | ICD-10-CM | POA: Diagnosis not present

## 2019-11-21 DIAGNOSIS — R1030 Lower abdominal pain, unspecified: Secondary | ICD-10-CM | POA: Diagnosis not present

## 2019-11-21 DIAGNOSIS — E1165 Type 2 diabetes mellitus with hyperglycemia: Secondary | ICD-10-CM | POA: Diagnosis not present

## 2019-11-26 DIAGNOSIS — Z0001 Encounter for general adult medical examination with abnormal findings: Secondary | ICD-10-CM | POA: Diagnosis not present

## 2019-11-26 DIAGNOSIS — E782 Mixed hyperlipidemia: Secondary | ICD-10-CM | POA: Diagnosis not present

## 2019-11-26 DIAGNOSIS — I10 Essential (primary) hypertension: Secondary | ICD-10-CM | POA: Diagnosis not present

## 2019-11-26 DIAGNOSIS — M791 Myalgia, unspecified site: Secondary | ICD-10-CM | POA: Diagnosis not present

## 2019-11-26 DIAGNOSIS — E1140 Type 2 diabetes mellitus with diabetic neuropathy, unspecified: Secondary | ICD-10-CM | POA: Diagnosis not present

## 2019-11-26 DIAGNOSIS — G9009 Other idiopathic peripheral autonomic neuropathy: Secondary | ICD-10-CM | POA: Diagnosis not present

## 2019-12-03 ENCOUNTER — Other Ambulatory Visit (HOSPITAL_COMMUNITY): Payer: Self-pay | Admitting: Internal Medicine

## 2019-12-03 DIAGNOSIS — Z13820 Encounter for screening for osteoporosis: Secondary | ICD-10-CM

## 2019-12-03 DIAGNOSIS — Z1231 Encounter for screening mammogram for malignant neoplasm of breast: Secondary | ICD-10-CM

## 2019-12-06 DIAGNOSIS — I10 Essential (primary) hypertension: Secondary | ICD-10-CM | POA: Diagnosis not present

## 2019-12-06 DIAGNOSIS — E7849 Other hyperlipidemia: Secondary | ICD-10-CM | POA: Diagnosis not present

## 2019-12-06 DIAGNOSIS — E1165 Type 2 diabetes mellitus with hyperglycemia: Secondary | ICD-10-CM | POA: Diagnosis not present

## 2020-01-07 ENCOUNTER — Ambulatory Visit (HOSPITAL_COMMUNITY)
Admission: RE | Admit: 2020-01-07 | Discharge: 2020-01-07 | Disposition: A | Discharge status: Home / Self Care | Payer: Medicare Other | Source: Ambulatory Visit | Attending: Internal Medicine | Admitting: Internal Medicine

## 2020-01-07 ENCOUNTER — Other Ambulatory Visit: Payer: Self-pay

## 2020-01-07 DIAGNOSIS — Z1231 Encounter for screening mammogram for malignant neoplasm of breast: Secondary | ICD-10-CM | POA: Diagnosis not present

## 2020-01-07 DIAGNOSIS — Z13820 Encounter for screening for osteoporosis: Secondary | ICD-10-CM | POA: Diagnosis not present

## 2020-01-07 DIAGNOSIS — M85851 Other specified disorders of bone density and structure, right thigh: Secondary | ICD-10-CM | POA: Diagnosis not present

## 2020-01-14 DIAGNOSIS — E782 Mixed hyperlipidemia: Secondary | ICD-10-CM | POA: Diagnosis not present

## 2020-01-14 DIAGNOSIS — E1140 Type 2 diabetes mellitus with diabetic neuropathy, unspecified: Secondary | ICD-10-CM | POA: Diagnosis not present

## 2020-01-14 DIAGNOSIS — I10 Essential (primary) hypertension: Secondary | ICD-10-CM | POA: Diagnosis not present

## 2020-01-14 DIAGNOSIS — E1165 Type 2 diabetes mellitus with hyperglycemia: Secondary | ICD-10-CM | POA: Diagnosis not present

## 2020-02-29 DIAGNOSIS — I10 Essential (primary) hypertension: Secondary | ICD-10-CM | POA: Diagnosis not present

## 2020-02-29 DIAGNOSIS — E1140 Type 2 diabetes mellitus with diabetic neuropathy, unspecified: Secondary | ICD-10-CM | POA: Diagnosis not present

## 2020-02-29 DIAGNOSIS — E782 Mixed hyperlipidemia: Secondary | ICD-10-CM | POA: Diagnosis not present

## 2020-02-29 DIAGNOSIS — E0840 Diabetes mellitus due to underlying condition with diabetic neuropathy, unspecified: Secondary | ICD-10-CM | POA: Diagnosis not present

## 2020-02-29 DIAGNOSIS — E1165 Type 2 diabetes mellitus with hyperglycemia: Secondary | ICD-10-CM | POA: Diagnosis not present

## 2020-03-11 DIAGNOSIS — R1030 Lower abdominal pain, unspecified: Secondary | ICD-10-CM | POA: Diagnosis not present

## 2020-03-11 DIAGNOSIS — E1165 Type 2 diabetes mellitus with hyperglycemia: Secondary | ICD-10-CM | POA: Diagnosis not present

## 2020-03-11 DIAGNOSIS — Z712 Person consulting for explanation of examination or test findings: Secondary | ICD-10-CM | POA: Diagnosis not present

## 2020-03-11 DIAGNOSIS — M10071 Idiopathic gout, right ankle and foot: Secondary | ICD-10-CM | POA: Diagnosis not present

## 2020-03-11 DIAGNOSIS — E1140 Type 2 diabetes mellitus with diabetic neuropathy, unspecified: Secondary | ICD-10-CM | POA: Diagnosis not present

## 2020-03-11 DIAGNOSIS — E782 Mixed hyperlipidemia: Secondary | ICD-10-CM | POA: Diagnosis not present

## 2020-03-11 DIAGNOSIS — E0840 Diabetes mellitus due to underlying condition with diabetic neuropathy, unspecified: Secondary | ICD-10-CM | POA: Diagnosis not present

## 2020-03-17 DIAGNOSIS — I10 Essential (primary) hypertension: Secondary | ICD-10-CM | POA: Diagnosis not present

## 2020-03-17 DIAGNOSIS — R1030 Lower abdominal pain, unspecified: Secondary | ICD-10-CM | POA: Diagnosis not present

## 2020-03-17 DIAGNOSIS — R42 Dizziness and giddiness: Secondary | ICD-10-CM | POA: Diagnosis not present

## 2020-03-17 DIAGNOSIS — B351 Tinea unguium: Secondary | ICD-10-CM | POA: Diagnosis not present

## 2020-03-17 DIAGNOSIS — M25531 Pain in right wrist: Secondary | ICD-10-CM | POA: Diagnosis not present

## 2020-03-17 DIAGNOSIS — M791 Myalgia, unspecified site: Secondary | ICD-10-CM | POA: Diagnosis not present

## 2020-03-17 DIAGNOSIS — E1140 Type 2 diabetes mellitus with diabetic neuropathy, unspecified: Secondary | ICD-10-CM | POA: Diagnosis not present

## 2020-03-17 DIAGNOSIS — E782 Mixed hyperlipidemia: Secondary | ICD-10-CM | POA: Diagnosis not present

## 2020-03-17 DIAGNOSIS — H8110 Benign paroxysmal vertigo, unspecified ear: Secondary | ICD-10-CM | POA: Diagnosis not present

## 2020-03-17 DIAGNOSIS — G9009 Other idiopathic peripheral autonomic neuropathy: Secondary | ICD-10-CM | POA: Diagnosis not present

## 2020-03-29 DIAGNOSIS — E1140 Type 2 diabetes mellitus with diabetic neuropathy, unspecified: Secondary | ICD-10-CM | POA: Diagnosis not present

## 2020-03-29 DIAGNOSIS — I10 Essential (primary) hypertension: Secondary | ICD-10-CM | POA: Diagnosis not present

## 2020-03-29 DIAGNOSIS — E782 Mixed hyperlipidemia: Secondary | ICD-10-CM | POA: Diagnosis not present

## 2020-03-29 DIAGNOSIS — E1165 Type 2 diabetes mellitus with hyperglycemia: Secondary | ICD-10-CM | POA: Diagnosis not present

## 2020-04-28 DIAGNOSIS — H8110 Benign paroxysmal vertigo, unspecified ear: Secondary | ICD-10-CM | POA: Diagnosis not present

## 2020-04-28 DIAGNOSIS — B351 Tinea unguium: Secondary | ICD-10-CM | POA: Diagnosis not present

## 2020-04-28 DIAGNOSIS — R1030 Lower abdominal pain, unspecified: Secondary | ICD-10-CM | POA: Diagnosis not present

## 2020-04-28 DIAGNOSIS — E1140 Type 2 diabetes mellitus with diabetic neuropathy, unspecified: Secondary | ICD-10-CM | POA: Diagnosis not present

## 2020-04-28 DIAGNOSIS — R42 Dizziness and giddiness: Secondary | ICD-10-CM | POA: Diagnosis not present

## 2020-04-28 DIAGNOSIS — M791 Myalgia, unspecified site: Secondary | ICD-10-CM | POA: Diagnosis not present

## 2020-04-28 DIAGNOSIS — M25531 Pain in right wrist: Secondary | ICD-10-CM | POA: Diagnosis not present

## 2020-04-28 DIAGNOSIS — I10 Essential (primary) hypertension: Secondary | ICD-10-CM | POA: Diagnosis not present

## 2020-04-28 DIAGNOSIS — E782 Mixed hyperlipidemia: Secondary | ICD-10-CM | POA: Diagnosis not present

## 2020-06-10 DIAGNOSIS — E1140 Type 2 diabetes mellitus with diabetic neuropathy, unspecified: Secondary | ICD-10-CM | POA: Diagnosis not present

## 2020-06-10 DIAGNOSIS — E782 Mixed hyperlipidemia: Secondary | ICD-10-CM | POA: Diagnosis not present

## 2020-06-10 DIAGNOSIS — E0840 Diabetes mellitus due to underlying condition with diabetic neuropathy, unspecified: Secondary | ICD-10-CM | POA: Diagnosis not present

## 2020-06-10 DIAGNOSIS — E1165 Type 2 diabetes mellitus with hyperglycemia: Secondary | ICD-10-CM | POA: Diagnosis not present

## 2020-06-10 DIAGNOSIS — I10 Essential (primary) hypertension: Secondary | ICD-10-CM | POA: Diagnosis not present

## 2020-06-16 DIAGNOSIS — E782 Mixed hyperlipidemia: Secondary | ICD-10-CM | POA: Diagnosis not present

## 2020-06-16 DIAGNOSIS — E1140 Type 2 diabetes mellitus with diabetic neuropathy, unspecified: Secondary | ICD-10-CM | POA: Diagnosis not present

## 2020-06-16 DIAGNOSIS — R42 Dizziness and giddiness: Secondary | ICD-10-CM | POA: Diagnosis not present

## 2020-06-16 DIAGNOSIS — R1030 Lower abdominal pain, unspecified: Secondary | ICD-10-CM | POA: Diagnosis not present

## 2020-06-16 DIAGNOSIS — G9009 Other idiopathic peripheral autonomic neuropathy: Secondary | ICD-10-CM | POA: Diagnosis not present

## 2020-06-16 DIAGNOSIS — B351 Tinea unguium: Secondary | ICD-10-CM | POA: Diagnosis not present

## 2020-06-16 DIAGNOSIS — H8110 Benign paroxysmal vertigo, unspecified ear: Secondary | ICD-10-CM | POA: Diagnosis not present

## 2020-06-16 DIAGNOSIS — M791 Myalgia, unspecified site: Secondary | ICD-10-CM | POA: Diagnosis not present

## 2020-06-16 DIAGNOSIS — I10 Essential (primary) hypertension: Secondary | ICD-10-CM | POA: Diagnosis not present

## 2020-06-16 DIAGNOSIS — M25531 Pain in right wrist: Secondary | ICD-10-CM | POA: Diagnosis not present

## 2020-07-16 DIAGNOSIS — E1165 Type 2 diabetes mellitus with hyperglycemia: Secondary | ICD-10-CM | POA: Diagnosis not present

## 2020-07-16 DIAGNOSIS — I10 Essential (primary) hypertension: Secondary | ICD-10-CM | POA: Diagnosis not present

## 2020-08-28 DIAGNOSIS — E1165 Type 2 diabetes mellitus with hyperglycemia: Secondary | ICD-10-CM | POA: Diagnosis not present

## 2020-08-28 DIAGNOSIS — I10 Essential (primary) hypertension: Secondary | ICD-10-CM | POA: Diagnosis not present

## 2020-09-23 DIAGNOSIS — I10 Essential (primary) hypertension: Secondary | ICD-10-CM | POA: Diagnosis not present

## 2020-09-23 DIAGNOSIS — E1140 Type 2 diabetes mellitus with diabetic neuropathy, unspecified: Secondary | ICD-10-CM | POA: Diagnosis not present

## 2020-09-23 DIAGNOSIS — E782 Mixed hyperlipidemia: Secondary | ICD-10-CM | POA: Diagnosis not present

## 2020-09-28 DIAGNOSIS — I10 Essential (primary) hypertension: Secondary | ICD-10-CM | POA: Diagnosis not present

## 2020-09-28 DIAGNOSIS — E1165 Type 2 diabetes mellitus with hyperglycemia: Secondary | ICD-10-CM | POA: Diagnosis not present

## 2020-09-29 DIAGNOSIS — E1143 Type 2 diabetes mellitus with diabetic autonomic (poly)neuropathy: Secondary | ICD-10-CM | POA: Diagnosis not present

## 2020-09-29 DIAGNOSIS — M8580 Other specified disorders of bone density and structure, unspecified site: Secondary | ICD-10-CM | POA: Diagnosis not present

## 2020-09-29 DIAGNOSIS — M791 Myalgia, unspecified site: Secondary | ICD-10-CM | POA: Diagnosis not present

## 2020-09-29 DIAGNOSIS — R490 Dysphonia: Secondary | ICD-10-CM | POA: Diagnosis not present

## 2020-09-29 DIAGNOSIS — B351 Tinea unguium: Secondary | ICD-10-CM | POA: Diagnosis not present

## 2020-09-29 DIAGNOSIS — E782 Mixed hyperlipidemia: Secondary | ICD-10-CM | POA: Diagnosis not present

## 2020-09-29 DIAGNOSIS — I10 Essential (primary) hypertension: Secondary | ICD-10-CM | POA: Diagnosis not present

## 2020-10-29 DIAGNOSIS — I10 Essential (primary) hypertension: Secondary | ICD-10-CM | POA: Diagnosis not present

## 2020-10-29 DIAGNOSIS — E1165 Type 2 diabetes mellitus with hyperglycemia: Secondary | ICD-10-CM | POA: Diagnosis not present

## 2020-11-28 DIAGNOSIS — I10 Essential (primary) hypertension: Secondary | ICD-10-CM | POA: Diagnosis not present

## 2020-11-28 DIAGNOSIS — E1165 Type 2 diabetes mellitus with hyperglycemia: Secondary | ICD-10-CM | POA: Diagnosis not present

## 2020-12-29 DIAGNOSIS — E1165 Type 2 diabetes mellitus with hyperglycemia: Secondary | ICD-10-CM | POA: Diagnosis not present

## 2020-12-29 DIAGNOSIS — I10 Essential (primary) hypertension: Secondary | ICD-10-CM | POA: Diagnosis not present

## 2021-01-02 DIAGNOSIS — E119 Type 2 diabetes mellitus without complications: Secondary | ICD-10-CM | POA: Diagnosis not present

## 2021-01-02 DIAGNOSIS — I10 Essential (primary) hypertension: Secondary | ICD-10-CM | POA: Diagnosis not present

## 2021-01-08 DIAGNOSIS — E1143 Type 2 diabetes mellitus with diabetic autonomic (poly)neuropathy: Secondary | ICD-10-CM | POA: Diagnosis not present

## 2021-01-08 DIAGNOSIS — B351 Tinea unguium: Secondary | ICD-10-CM | POA: Diagnosis not present

## 2021-01-08 DIAGNOSIS — Z0001 Encounter for general adult medical examination with abnormal findings: Secondary | ICD-10-CM | POA: Diagnosis not present

## 2021-01-08 DIAGNOSIS — M8580 Other specified disorders of bone density and structure, unspecified site: Secondary | ICD-10-CM | POA: Diagnosis not present

## 2021-01-08 DIAGNOSIS — E782 Mixed hyperlipidemia: Secondary | ICD-10-CM | POA: Diagnosis not present

## 2021-01-08 DIAGNOSIS — M791 Myalgia, unspecified site: Secondary | ICD-10-CM | POA: Diagnosis not present

## 2021-01-08 DIAGNOSIS — R490 Dysphonia: Secondary | ICD-10-CM | POA: Diagnosis not present

## 2021-01-08 DIAGNOSIS — I10 Essential (primary) hypertension: Secondary | ICD-10-CM | POA: Diagnosis not present

## 2021-01-19 DIAGNOSIS — E119 Type 2 diabetes mellitus without complications: Secondary | ICD-10-CM | POA: Diagnosis not present

## 2021-01-28 DIAGNOSIS — E1165 Type 2 diabetes mellitus with hyperglycemia: Secondary | ICD-10-CM | POA: Diagnosis not present

## 2021-01-28 DIAGNOSIS — I10 Essential (primary) hypertension: Secondary | ICD-10-CM | POA: Diagnosis not present

## 2021-02-26 ENCOUNTER — Other Ambulatory Visit (HOSPITAL_COMMUNITY): Payer: Self-pay | Admitting: Internal Medicine

## 2021-02-26 DIAGNOSIS — Z1231 Encounter for screening mammogram for malignant neoplasm of breast: Secondary | ICD-10-CM

## 2021-02-27 DIAGNOSIS — E782 Mixed hyperlipidemia: Secondary | ICD-10-CM | POA: Diagnosis not present

## 2021-02-27 DIAGNOSIS — I10 Essential (primary) hypertension: Secondary | ICD-10-CM | POA: Diagnosis not present

## 2021-03-06 ENCOUNTER — Other Ambulatory Visit: Payer: Self-pay

## 2021-03-06 ENCOUNTER — Ambulatory Visit (HOSPITAL_COMMUNITY)
Admission: RE | Admit: 2021-03-06 | Discharge: 2021-03-06 | Disposition: A | Discharge status: Home / Self Care | Payer: Medicare Other | Source: Ambulatory Visit | Attending: Internal Medicine | Admitting: Internal Medicine

## 2021-03-06 DIAGNOSIS — Z1231 Encounter for screening mammogram for malignant neoplasm of breast: Secondary | ICD-10-CM | POA: Insufficient documentation

## 2021-03-31 DIAGNOSIS — E782 Mixed hyperlipidemia: Secondary | ICD-10-CM | POA: Diagnosis not present

## 2021-03-31 DIAGNOSIS — I10 Essential (primary) hypertension: Secondary | ICD-10-CM | POA: Diagnosis not present

## 2021-04-22 DIAGNOSIS — E782 Mixed hyperlipidemia: Secondary | ICD-10-CM | POA: Diagnosis not present

## 2021-04-22 DIAGNOSIS — E1143 Type 2 diabetes mellitus with diabetic autonomic (poly)neuropathy: Secondary | ICD-10-CM | POA: Diagnosis not present

## 2021-04-29 DIAGNOSIS — M8580 Other specified disorders of bone density and structure, unspecified site: Secondary | ICD-10-CM | POA: Diagnosis not present

## 2021-04-29 DIAGNOSIS — E782 Mixed hyperlipidemia: Secondary | ICD-10-CM | POA: Diagnosis not present

## 2021-04-29 DIAGNOSIS — E1143 Type 2 diabetes mellitus with diabetic autonomic (poly)neuropathy: Secondary | ICD-10-CM | POA: Diagnosis not present

## 2021-04-29 DIAGNOSIS — R490 Dysphonia: Secondary | ICD-10-CM | POA: Diagnosis not present

## 2021-04-29 DIAGNOSIS — I10 Essential (primary) hypertension: Secondary | ICD-10-CM | POA: Diagnosis not present

## 2021-04-29 DIAGNOSIS — B351 Tinea unguium: Secondary | ICD-10-CM | POA: Diagnosis not present

## 2021-04-29 DIAGNOSIS — M791 Myalgia, unspecified site: Secondary | ICD-10-CM | POA: Diagnosis not present

## 2021-09-08 DIAGNOSIS — E1143 Type 2 diabetes mellitus with diabetic autonomic (poly)neuropathy: Secondary | ICD-10-CM | POA: Diagnosis not present

## 2021-09-08 DIAGNOSIS — E782 Mixed hyperlipidemia: Secondary | ICD-10-CM | POA: Diagnosis not present

## 2021-09-14 DIAGNOSIS — M8580 Other specified disorders of bone density and structure, unspecified site: Secondary | ICD-10-CM | POA: Diagnosis not present

## 2021-09-14 DIAGNOSIS — R490 Dysphonia: Secondary | ICD-10-CM | POA: Diagnosis not present

## 2021-09-14 DIAGNOSIS — E782 Mixed hyperlipidemia: Secondary | ICD-10-CM | POA: Diagnosis not present

## 2021-09-14 DIAGNOSIS — M542 Cervicalgia: Secondary | ICD-10-CM | POA: Diagnosis not present

## 2021-09-14 DIAGNOSIS — T466X5S Adverse effect of antihyperlipidemic and antiarteriosclerotic drugs, sequela: Secondary | ICD-10-CM | POA: Diagnosis not present

## 2021-09-14 DIAGNOSIS — I10 Essential (primary) hypertension: Secondary | ICD-10-CM | POA: Diagnosis not present

## 2021-09-14 DIAGNOSIS — M791 Myalgia, unspecified site: Secondary | ICD-10-CM | POA: Diagnosis not present

## 2021-09-14 DIAGNOSIS — E1143 Type 2 diabetes mellitus with diabetic autonomic (poly)neuropathy: Secondary | ICD-10-CM | POA: Diagnosis not present

## 2021-12-22 DIAGNOSIS — E1143 Type 2 diabetes mellitus with diabetic autonomic (poly)neuropathy: Secondary | ICD-10-CM | POA: Diagnosis not present

## 2021-12-22 DIAGNOSIS — E782 Mixed hyperlipidemia: Secondary | ICD-10-CM | POA: Diagnosis not present

## 2021-12-28 DIAGNOSIS — Z2820 Immunization not carried out because of patient decision for unspecified reason: Secondary | ICD-10-CM | POA: Diagnosis not present

## 2021-12-28 DIAGNOSIS — G720 Drug-induced myopathy: Secondary | ICD-10-CM | POA: Diagnosis not present

## 2021-12-28 DIAGNOSIS — E782 Mixed hyperlipidemia: Secondary | ICD-10-CM | POA: Diagnosis not present

## 2021-12-28 DIAGNOSIS — E1143 Type 2 diabetes mellitus with diabetic autonomic (poly)neuropathy: Secondary | ICD-10-CM | POA: Diagnosis not present

## 2021-12-28 DIAGNOSIS — M8580 Other specified disorders of bone density and structure, unspecified site: Secondary | ICD-10-CM | POA: Diagnosis not present

## 2021-12-28 DIAGNOSIS — Z13820 Encounter for screening for osteoporosis: Secondary | ICD-10-CM | POA: Diagnosis not present

## 2021-12-28 DIAGNOSIS — M542 Cervicalgia: Secondary | ICD-10-CM | POA: Diagnosis not present

## 2021-12-28 DIAGNOSIS — Z0000 Encounter for general adult medical examination without abnormal findings: Secondary | ICD-10-CM | POA: Diagnosis not present

## 2021-12-28 DIAGNOSIS — R944 Abnormal results of kidney function studies: Secondary | ICD-10-CM | POA: Diagnosis not present

## 2021-12-28 DIAGNOSIS — I10 Essential (primary) hypertension: Secondary | ICD-10-CM | POA: Diagnosis not present

## 2021-12-28 DIAGNOSIS — R490 Dysphonia: Secondary | ICD-10-CM | POA: Diagnosis not present

## 2021-12-31 IMAGING — MG DIGITAL SCREENING BILAT W/ TOMO W/ CAD
8 series · 9 of 24 positions shown · non-contrast
Comparison: Previous exam(s).

CLINICAL DATA: Screening.

EXAM:
DIGITAL SCREENING BILATERAL MAMMOGRAM WITH TOMO AND CAD

[L CC synth-2D]
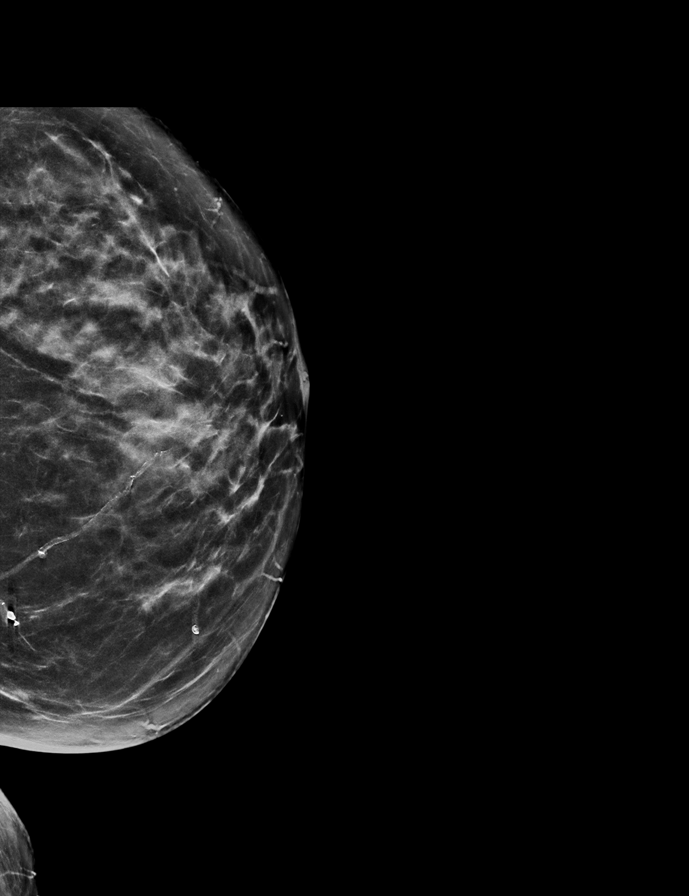

[R CC synth-2D]
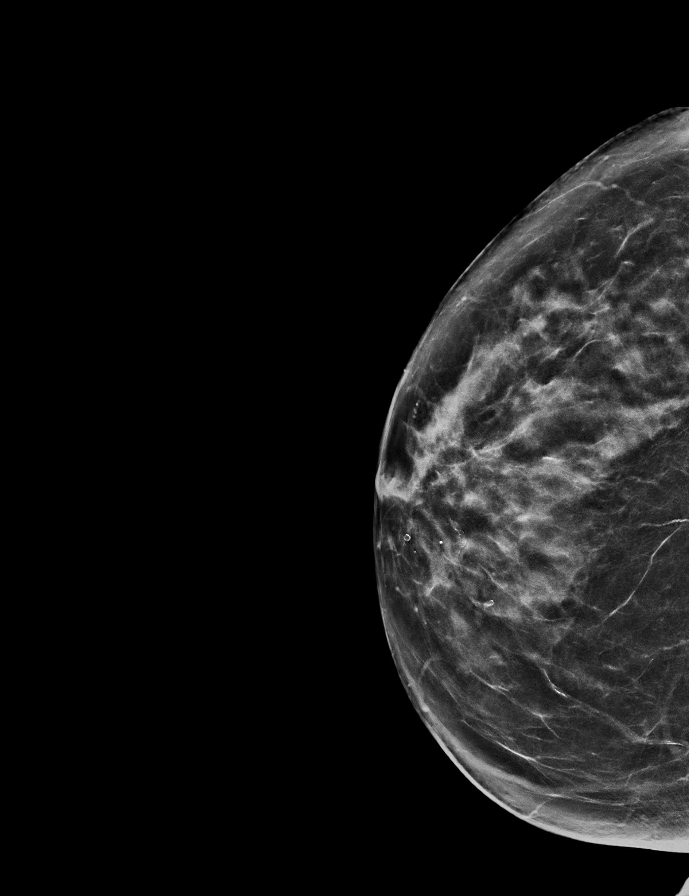

[R MLO synth-2D]
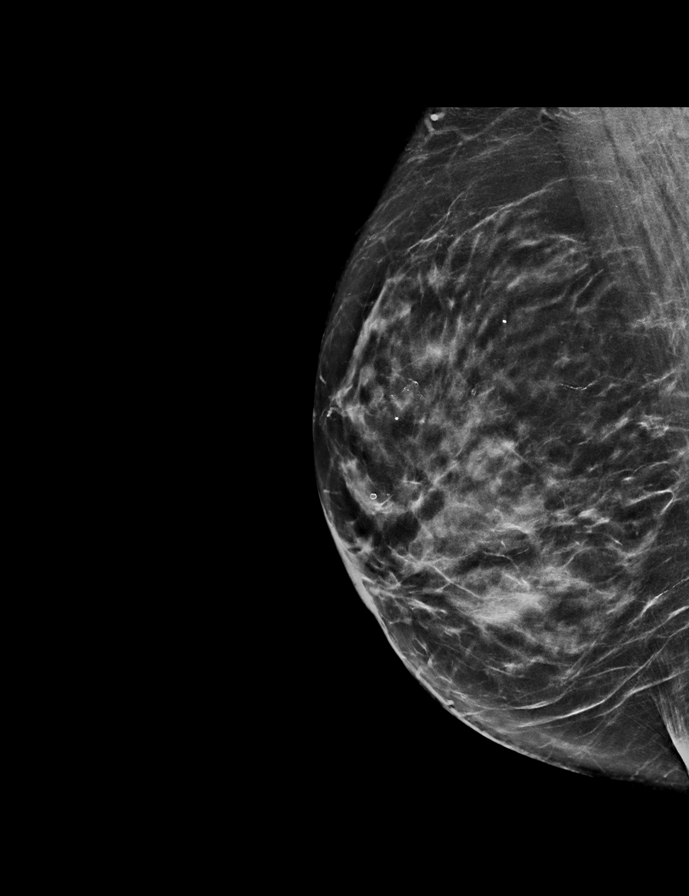

[L MLO synth-2D]
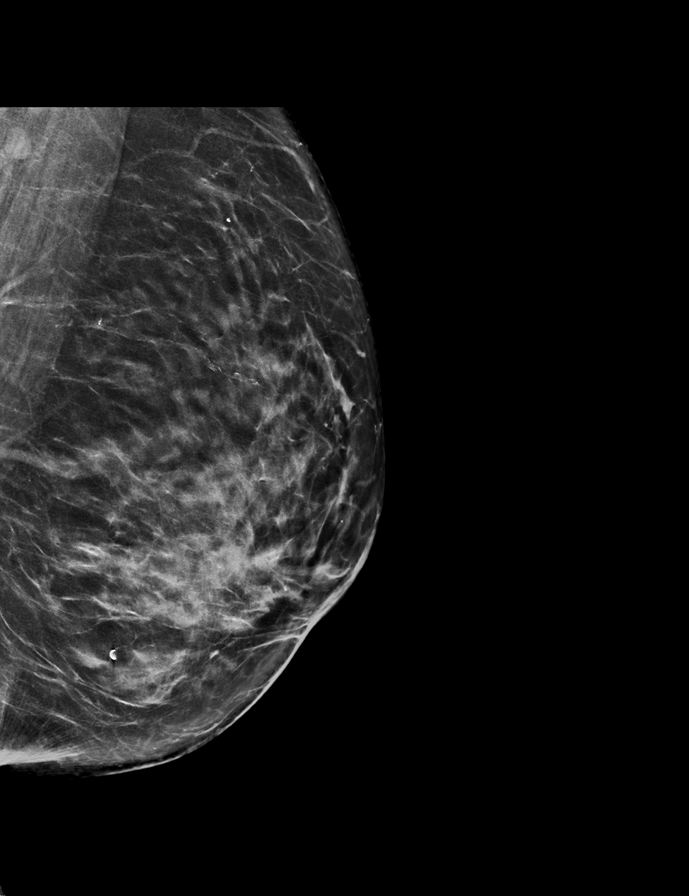

[R CC tomo · 2 of 66 frames shown]
[frame 22/66]
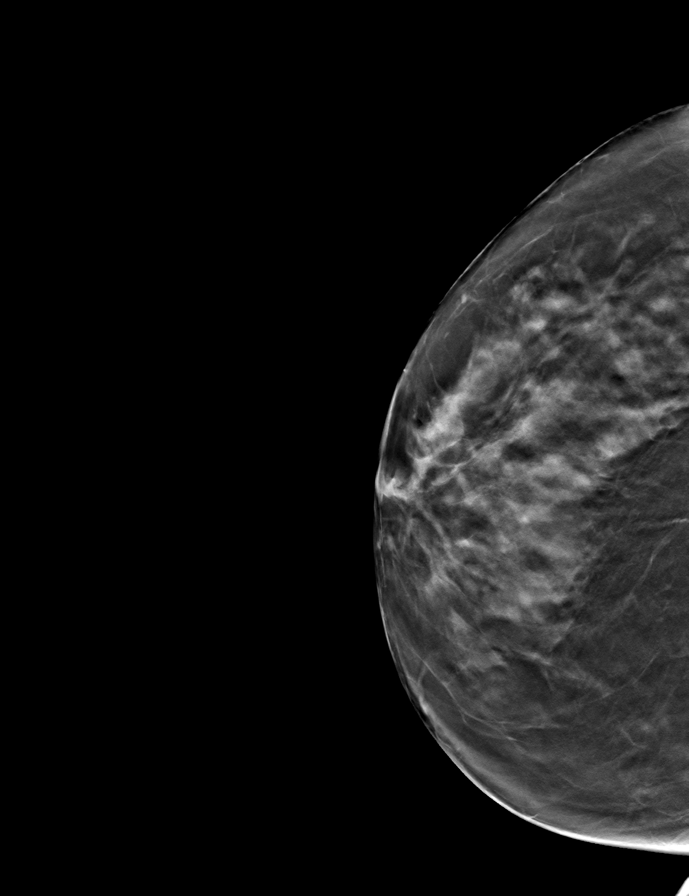
[frame 33/66]
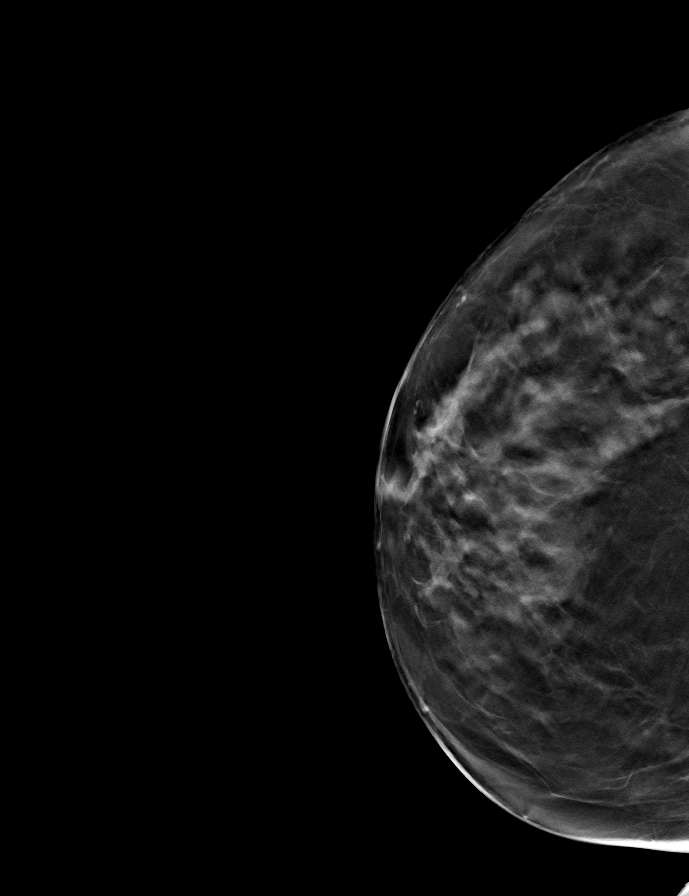

[R MLO tomo · tomo slice 34/67.0]
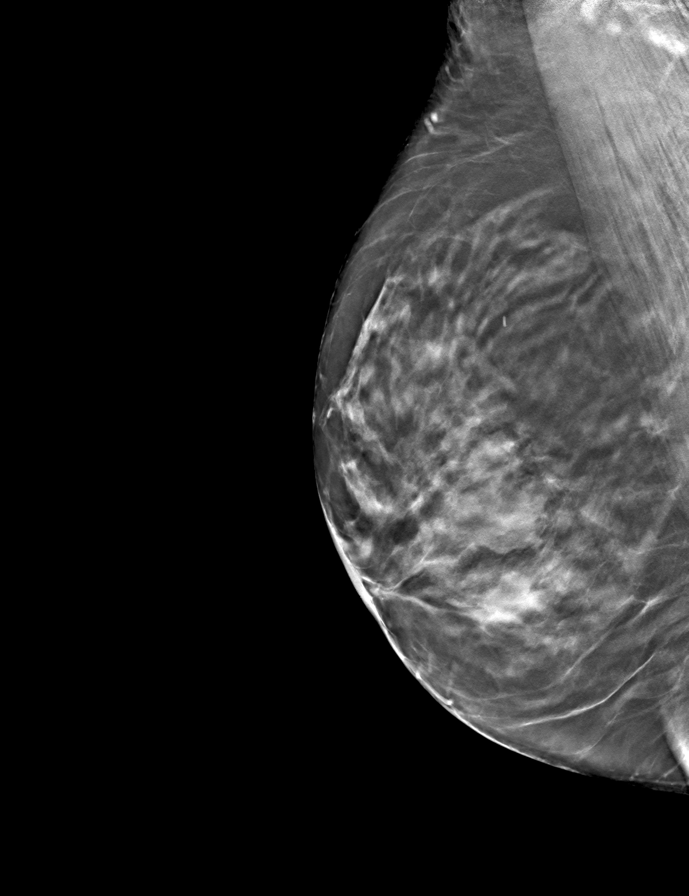

[L CC tomo · tomo slice 33/64.0]
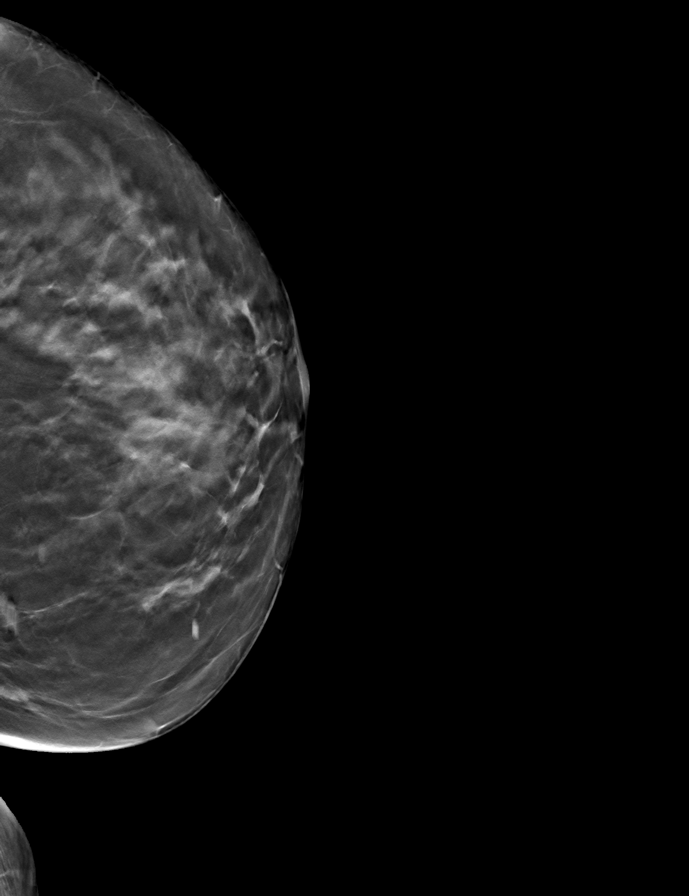

[L MLO tomo · tomo slice 37/72.0]
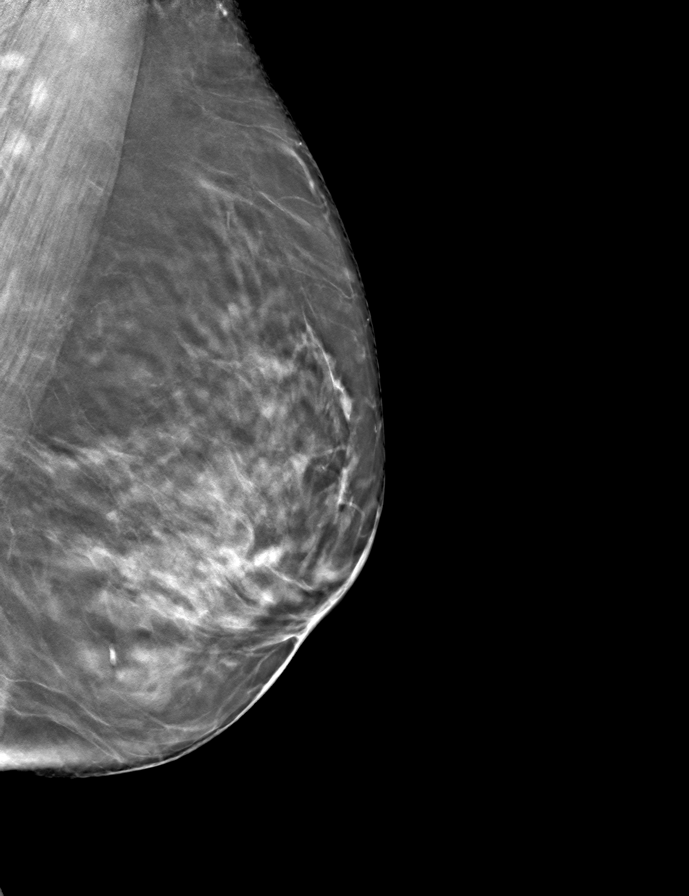

[9 of 24 positions shown; findings below may reference images not displayed]

ACR Breast Density Category c: The breast tissue is heterogeneously
dense, which may obscure small masses.
FINDINGS: There are no findings suspicious for malignancy. Images were
processed with CAD.
IMPRESSION: No mammographic evidence of malignancy. A result letter of this
screening mammogram will be mailed directly to the patient.

RECOMMENDATION:
Screening mammogram in one year. (Code:[id number])

BI-RADS CATEGORY  1: Negative.

## 2022-01-05 DIAGNOSIS — Z1211 Encounter for screening for malignant neoplasm of colon: Secondary | ICD-10-CM | POA: Diagnosis not present

## 2022-01-14 LAB — COLOGUARD: COLOGUARD: NEGATIVE

## 2022-01-25 DIAGNOSIS — E119 Type 2 diabetes mellitus without complications: Secondary | ICD-10-CM | POA: Diagnosis not present

## 2022-02-15 ENCOUNTER — Other Ambulatory Visit (HOSPITAL_COMMUNITY): Payer: Self-pay | Admitting: Internal Medicine

## 2022-02-15 DIAGNOSIS — Z13820 Encounter for screening for osteoporosis: Secondary | ICD-10-CM

## 2022-02-15 DIAGNOSIS — Z1231 Encounter for screening mammogram for malignant neoplasm of breast: Secondary | ICD-10-CM

## 2022-03-08 ENCOUNTER — Ambulatory Visit (HOSPITAL_COMMUNITY)
Admission: RE | Admit: 2022-03-08 | Discharge: 2022-03-08 | Disposition: A | Discharge status: Home / Self Care | Payer: Medicare Other | Source: Ambulatory Visit | Attending: Internal Medicine | Admitting: Internal Medicine

## 2022-03-08 DIAGNOSIS — Z780 Asymptomatic menopausal state: Secondary | ICD-10-CM | POA: Diagnosis not present

## 2022-03-08 DIAGNOSIS — M85851 Other specified disorders of bone density and structure, right thigh: Secondary | ICD-10-CM | POA: Diagnosis not present

## 2022-03-08 DIAGNOSIS — Z1231 Encounter for screening mammogram for malignant neoplasm of breast: Secondary | ICD-10-CM | POA: Diagnosis not present

## 2022-03-08 DIAGNOSIS — Z13820 Encounter for screening for osteoporosis: Secondary | ICD-10-CM | POA: Insufficient documentation

## 2022-03-10 ENCOUNTER — Other Ambulatory Visit (HOSPITAL_COMMUNITY): Payer: Self-pay | Admitting: Internal Medicine

## 2022-03-10 DIAGNOSIS — R928 Other abnormal and inconclusive findings on diagnostic imaging of breast: Secondary | ICD-10-CM

## 2022-03-23 ENCOUNTER — Ambulatory Visit (HOSPITAL_COMMUNITY)
Admission: RE | Admit: 2022-03-23 | Discharge: 2022-03-23 | Disposition: A | Discharge status: Home / Self Care | Payer: Medicare Other | Source: Ambulatory Visit | Attending: Internal Medicine | Admitting: Internal Medicine

## 2022-03-23 DIAGNOSIS — R928 Other abnormal and inconclusive findings on diagnostic imaging of breast: Secondary | ICD-10-CM

## 2022-03-23 DIAGNOSIS — R921 Mammographic calcification found on diagnostic imaging of breast: Secondary | ICD-10-CM | POA: Diagnosis not present

## 2022-03-25 DIAGNOSIS — Z1283 Encounter for screening for malignant neoplasm of skin: Secondary | ICD-10-CM | POA: Diagnosis not present

## 2022-03-25 DIAGNOSIS — D225 Melanocytic nevi of trunk: Secondary | ICD-10-CM | POA: Diagnosis not present

## 2022-04-15 DIAGNOSIS — E782 Mixed hyperlipidemia: Secondary | ICD-10-CM | POA: Diagnosis not present

## 2022-04-15 DIAGNOSIS — E1143 Type 2 diabetes mellitus with diabetic autonomic (poly)neuropathy: Secondary | ICD-10-CM | POA: Diagnosis not present

## 2022-04-21 DIAGNOSIS — R944 Abnormal results of kidney function studies: Secondary | ICD-10-CM | POA: Diagnosis not present

## 2022-04-21 DIAGNOSIS — E1143 Type 2 diabetes mellitus with diabetic autonomic (poly)neuropathy: Secondary | ICD-10-CM | POA: Diagnosis not present

## 2022-04-21 DIAGNOSIS — M542 Cervicalgia: Secondary | ICD-10-CM | POA: Diagnosis not present

## 2022-04-21 DIAGNOSIS — E782 Mixed hyperlipidemia: Secondary | ICD-10-CM | POA: Diagnosis not present

## 2022-04-21 DIAGNOSIS — T466X5D Adverse effect of antihyperlipidemic and antiarteriosclerotic drugs, subsequent encounter: Secondary | ICD-10-CM | POA: Diagnosis not present

## 2022-04-21 DIAGNOSIS — M8580 Other specified disorders of bone density and structure, unspecified site: Secondary | ICD-10-CM | POA: Diagnosis not present

## 2022-04-21 DIAGNOSIS — E1142 Type 2 diabetes mellitus with diabetic polyneuropathy: Secondary | ICD-10-CM | POA: Diagnosis not present

## 2022-04-21 DIAGNOSIS — I10 Essential (primary) hypertension: Secondary | ICD-10-CM | POA: Diagnosis not present

## 2022-04-21 DIAGNOSIS — G720 Drug-induced myopathy: Secondary | ICD-10-CM | POA: Diagnosis not present

## 2022-05-20 ENCOUNTER — Ambulatory Visit
Admission: EM | Admit: 2022-05-20 | Discharge: 2022-05-20 | Disposition: A | Discharge status: Home / Self Care | Payer: Medicare Other

## 2022-05-20 ENCOUNTER — Encounter: Payer: Self-pay | Admitting: Emergency Medicine

## 2022-05-20 DIAGNOSIS — S40021A Contusion of right upper arm, initial encounter: Secondary | ICD-10-CM

## 2022-05-20 NOTE — Discharge Instructions (Addendum)
As we discussed, you have a contusion on your right arm which is a bruise under the skin.  You can continue Tylenol and can also use ice to help with pain and swelling.  You can also use a topical muscle rub to help with pain.  I do not think anything in your arm is broken.

## 2022-05-20 NOTE — ED Provider Notes (Signed)
[facility]    CSN: [id number] Arrival date & time: 05/20/22  0906      History   Chief Complaint No chief complaint on file.   HPI MEAGHEN Lamb is a 72 y.o. female.   Patient presents today for right shoulder, upper arm, and wrist pain.  Reports on Saturday, she fell down her indoor steps while she was taking her mattress pad downstairs to wash it.  Reports since then, she has had continued pain in her right shoulder and upper arm and wrist.  No swelling, redness, bruising, obvious deformity, or decreased range of motion.  She does report it hurts with certain movements.  She reports there is a bruise on her rib cage, however it is not tender and she is not having any difficulty breathing or shortness of breath or pain in her rib cage.  Has taken Tylenol for the pain which helps.  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      Past Medical History:  Diagnosis Date   Arthritis    Diabetes mellitus without complication ([place])    Hypertension     There are no problems to display for this patient.   Past Surgical History:  Procedure Laterality Date   APPENDECTOMY     CESAREAN SECTION      OB History   No obstetric history on file.      Home Medications    Prior to Admission medications   Medication Sig Start Date End Date Taking? Authorizing Provider  calcium carbonate (OSCAL) 1500 (600 Ca) MG TABS tablet Take 600 mg of elemental calcium by mouth 2 (two) times daily with a meal.   Yes [provider]  glipiZIDE (GLUCOTROL XL) 2.5 MG 24 hr tablet Take 2.5 mg by mouth daily with breakfast.   Yes [provider]  omega-3 acid ethyl esters (LOVAZA) 1 g capsule Take 1 g by mouth 2 (two) times daily.   Yes [provider]  amLODipine (NORVASC) 5 MG tablet Take 5 mg by mouth daily.    [provider]  metFORMIN (GLUCOPHAGE) 500 MG tablet Take by mouth 2 (two) times daily with a meal.    [provider]  pioglitazone (ACTOS) 15 MG tablet  08/04/17   [provider]  valsartan-hydrochlorothiazide (DIOVAN-HCT) 160-12.5 MG tablet Take 1 tablet by mouth daily.    [provider]    Family History Family History  Problem Relation Age of Onset   Thyroid disease Sister    Diabetes Sister    Graves' disease Sister    Cancer Brother     Social History Social History   Tobacco Use   Smoking status: Never   Smokeless tobacco: Never  Substance Use Topics   Alcohol use: Never   Drug use: Never     Allergies   Ezetimibe, Glimepiride, Glipizide, Januvia [sitagliptin], Jardiance [empagliflozin], Lovastatin, Pioglitazone, and Pravastatin   Review of Systems Review of Systems Per HPI  Physical Exam Triage Vital Signs ED Triage Vitals  Enc Vitals Group     BP 05/20/22 0934 (!) 147/74     Pulse Rate 05/20/22 0934 82     Resp 05/20/22 0934 18     Temp 05/20/22 0934 98.1 F (36.7 C)     Temp Source 05/20/22 0934 Oral     SpO2 05/20/22 0934 97 %     Weight --      Height --      Head  Circumference --      Peak Flow --      Pain Score 05/20/22 0935 5     Pain Loc --      Pain Edu? --      Excl. in [place]? --    No data found.  Updated Vital Signs BP (!) 147/74 (BP Location: Right Arm)   Pulse 82   Temp 98.1 F (36.7 C) (Oral)   Resp 18   SpO2 97%   Visual Acuity Right Eye Distance:   Left Eye Distance:   Bilateral Distance:    Right Eye Near:   Left Eye Near:    Bilateral Near:     Physical Exam Vitals and nursing note reviewed.  Constitutional:      General: She is not in acute distress.    Appearance: Normal appearance. She is not toxic-appearing.  HENT:     Mouth/Throat:     Mouth: Mucous membranes are moist.     Pharynx: Oropharynx is clear.  Pulmonary:     Effort: Pulmonary effort is normal. No respiratory distress.  Musculoskeletal:     Right shoulder: Normal. No swelling, deformity, tenderness or bony tenderness. Normal  range of motion. Normal strength. Normal pulse.     Right upper arm: Normal. No swelling, edema, deformity, lacerations, tenderness or bony tenderness.     Right elbow: Normal. No swelling, deformity, effusion or lacerations. Normal range of motion. No tenderness.     Right wrist: Normal. No swelling, deformity, effusion, lacerations, tenderness, bony tenderness, snuff box tenderness or crepitus. Normal range of motion. Normal pulse.     Right hand: Normal. No swelling, deformity, lacerations, tenderness or bony tenderness. Normal range of motion. Normal strength. Normal sensation. Normal capillary refill. Normal pulse.     Left hand: Normal. No swelling, deformity, lacerations, tenderness or bony tenderness. Normal range of motion. Normal strength. Normal sensation. Normal capillary refill. Normal pulse.       Arms:     Comments: During examination, patient is able to move all 4 extremities equally.  Inspection: No swelling, obvious deformity, or redness to right upper extremity.  There is an area of ecchymosis to the posterior right arm in approximately area marked that is not tender to touch, edematous, erythematous, or fluctuant.    Palpation: right upper arm nontender to palpation at the shoulder, upper arm, elbow, and wrist; no obvious deformities palpated ROM: Full ROM to right shoulder, elbow, right wrist, all 5 digits Strength: 5/5 bilateral upper extremities Neurovascular: neurovascularly intact in left and right upper extremity   Skin:    General: Skin is warm and dry.     Capillary Refill: Capillary refill takes less than 2 seconds.     Coloration: Skin is not jaundiced or pale.     Findings: No erythema.  Neurological:     Mental Status: She is alert and oriented to person, place, and time.  Psychiatric:        Behavior: Behavior is cooperative.      UC Treatments / Results  Labs (all labs ordered are listed, but only abnormal results are displayed) Labs Reviewed - No data  to display  EKG   Radiology No results found.  Procedures Procedures (including critical care time)  Medications Ordered in UC Medications - No data to display  Initial Impression / Assessment and Plan / UC Course  I have reviewed the triage vital signs and the nursing notes.  Pertinent labs & imaging results that were available during  my care of the patient were reviewed by me and considered in my medical decision making (see chart for details).   Patient is well-appearing, normotensive, afebrile, not tachycardic, not tachypneic, oxygenating well on room air.    1. Contusion of right upper extremity, initial encounter Vital signs and examination today are reassuring During examination, patient was visualized moving both upper extremities equally and without issue Discussed with patient that I have low suspicion for acute fractures After shared decision-making, patient elected to proceed without x-ray imaging Supportive care discussed Recommended continued use of Tylenol, ice, light range of motion and stretching exercises Seek care for persistent or worsening symptoms despite treatment  The patient was given the opportunity to ask questions.  All questions answered to their satisfaction.  The patient is in agreement to this plan.    Final Clinical Impressions(s) / UC Diagnoses   Final diagnoses:  Contusion of right upper extremity, initial encounter     Discharge Instructions      As we discussed, you have a contusion on your right arm which is a bruise under the skin.  You can continue Tylenol and can also use ice to help with pain and swelling.  You can also use a topical muscle rub to help with pain.  I do not think anything in your arm is broken.   ED Prescriptions   None    PDMP not reviewed this encounter.   Eulogio Bear, NP 05/20/22 1012

## 2022-05-20 NOTE — ED Triage Notes (Signed)
Fell down steps on Saturday. C/o right shoulder, right upper arm, and right wrist pain.

## 2022-08-16 DIAGNOSIS — E1143 Type 2 diabetes mellitus with diabetic autonomic (poly)neuropathy: Secondary | ICD-10-CM | POA: Diagnosis not present

## 2022-08-16 DIAGNOSIS — E782 Mixed hyperlipidemia: Secondary | ICD-10-CM | POA: Diagnosis not present

## 2022-08-20 DIAGNOSIS — E1143 Type 2 diabetes mellitus with diabetic autonomic (poly)neuropathy: Secondary | ICD-10-CM | POA: Diagnosis not present

## 2022-08-20 DIAGNOSIS — M542 Cervicalgia: Secondary | ICD-10-CM | POA: Diagnosis not present

## 2022-08-20 DIAGNOSIS — R944 Abnormal results of kidney function studies: Secondary | ICD-10-CM | POA: Diagnosis not present

## 2022-08-20 DIAGNOSIS — G720 Drug-induced myopathy: Secondary | ICD-10-CM | POA: Diagnosis not present

## 2022-08-20 DIAGNOSIS — E782 Mixed hyperlipidemia: Secondary | ICD-10-CM | POA: Diagnosis not present

## 2022-08-20 DIAGNOSIS — Z713 Dietary counseling and surveillance: Secondary | ICD-10-CM | POA: Diagnosis not present

## 2022-08-20 DIAGNOSIS — I10 Essential (primary) hypertension: Secondary | ICD-10-CM | POA: Diagnosis not present

## 2022-08-20 DIAGNOSIS — M25511 Pain in right shoulder: Secondary | ICD-10-CM | POA: Diagnosis not present

## 2022-08-20 DIAGNOSIS — Z7182 Exercise counseling: Secondary | ICD-10-CM | POA: Diagnosis not present

## 2022-08-20 DIAGNOSIS — M8580 Other specified disorders of bone density and structure, unspecified site: Secondary | ICD-10-CM | POA: Diagnosis not present

## 2022-08-20 DIAGNOSIS — E1142 Type 2 diabetes mellitus with diabetic polyneuropathy: Secondary | ICD-10-CM | POA: Diagnosis not present

## 2022-09-06 ENCOUNTER — Other Ambulatory Visit: Payer: Self-pay

## 2022-09-06 ENCOUNTER — Encounter (HOSPITAL_COMMUNITY): Payer: Self-pay | Admitting: Occupational Therapy

## 2022-09-06 ENCOUNTER — Ambulatory Visit (HOSPITAL_COMMUNITY): Payer: Medicare Other | Attending: Internal Medicine | Admitting: Occupational Therapy

## 2022-09-06 DIAGNOSIS — M25511 Pain in right shoulder: Secondary | ICD-10-CM | POA: Insufficient documentation

## 2022-09-06 DIAGNOSIS — R29898 Other symptoms and signs involving the musculoskeletal system: Secondary | ICD-10-CM | POA: Diagnosis not present

## 2022-09-06 NOTE — Patient Instructions (Signed)
Repeat all exercises 10-15 times, 1-2 times per day.   1) Seated Row   Sit up straight with elbows by your sides. Pull back with shoulders/elbows, keeping forearms straight, as if pulling back on the reins of a horse. Squeeze shoulder blades together.    2) Shoulder Elevation    Sit up straight with arms by your sides. Slowly bring your shoulders up towards your ears.    3) Shoulder Extension    Sit up straight with both arms by your side, draw your arms back behind your waist. Keep your elbows straight.    4) Shoulder Protraction    Begin with elbows by your side, slowly "punch" straight out in front of you.      5) Shoulder Flexion  Standing:         Begin with arms at your side with thumbs pointed up, slowly raise both arms up and forward towards overhead.     6) Horizontal abduction/adduction  Standing:           Begin with arms straight out in front of you, bring out to the side in at "T" shape. Keep arms straight entire time.     7) Internal & External Rotation   Standing:   Stand with elbows at the side and elbows bent 90 degrees. Move your forearms away from your body, then bring back inward toward the body.     8) Shoulder Abduction  Standing:       Slowly move your arms out to the side so that they go overhead, in a jumping jack or snow angel movement.

## 2022-09-06 NOTE — Therapy (Signed)
OUTPATIENT OCCUPATIONAL THERAPY ORTHO EVALUATION  Patient Name: Laurie Lamb MRN: [id number] DOB:[birth year], 72 y.o., female Today's Date: 09/06/2022   END OF SESSION:  OT End of Session - 09/06/22 1002     Visit Number 1    Number of Visits 6    Date for OT Re-Evaluation 10/18/22    Authorization Type [employer] Medicare, $20 copay    Progress Note Due on Visit 10    OT Start Time 0927    OT Stop Time 0957    OT Time Calculation (min) 30 min    Activity Tolerance Patient tolerated treatment well    Behavior During Therapy WFL for tasks assessed/performed             Past Medical History:  Diagnosis Date   Arthritis    Diabetes mellitus without complication ([facility])    Hypertension    Past Surgical History:  Procedure Laterality Date   APPENDECTOMY     CESAREAN SECTION     There are no problems to display for this patient.   PCP: Dr. Nita Sells REFERRING PROVIDER: Dr. Nita Sells  ONSET DATE: 05/15/22  REFERRING DIAG: pain in right shoulder  THERAPY DIAG:  Acute pain of right shoulder  Other symptoms and signs involving the musculoskeletal system  Rationale for Evaluation and Treatment: Rehabilitation  SUBJECTIVE:   SUBJECTIVE STATEMENT: S: I fell down the steps on March 16th.  Pt accompanied by: self  PERTINENT HISTORY: Pt is a 72 y/o female presenting with right shoulder pain, present since March 16th when she fell down the steps while carrying mattress topper down the steps. Pt has not seen an orthopedic MD yet, is trying therapy first.   PRECAUTIONS: None  WEIGHT BEARING RESTRICTIONS: No  PAIN:  Are you having pain? No  FALLS: Has patient fallen in last 6 months? Yes. Number of falls 1  PLOF: Independent  PATIENT GOALS: To have less pain and improved use of the RUE  NEXT MD VISIT: December 2024  OBJECTIVE:   HAND DOMINANCE: Right  ADLs: Overall ADLs: Pt reports difficulty with reaching back, folding the covers back when getting into the bed.  Pushing the door open, writing, pulling or pushing items. Pt has difficulty sleeping on the right side and reaching up overhead.   FUNCTIONAL OUTCOME MEASURES: FOTO: 59/100  UPPER EXTREMITY ROM:       Assessed in sitting, er/IR adducted  Active ROM Right eval  Shoulder flexion 145  Shoulder abduction 146  Shoulder internal rotation 90  Shoulder external rotation 69  (Blank rows = not tested)    UPPER EXTREMITY MMT:     Assessed in sitting, er/IR adducted  MMT Right eval  Shoulder flexion 5/5  Shoulder abduction 4/5  Shoulder internal rotation 5/5  Shoulder external rotation 4+/5  (Blank rows = not tested)  HAND FUNCTION: Grip strength: Right: 55 lbs; Left: 45 lbs  SENSATION: Pt with mild tingling in upper arm down to elbow and forearm  COGNITION: Overall cognitive status: Within functional limits for tasks assessed  OBSERVATIONS: min/mod fascial restrictions along right upper arm, anterior shoulder, and trapezius   TODAY'S TREATMENT:  DATE: N/A-eval only    PATIENT EDUCATION: Education details: A/ROM, scapular A/ROM Person educated: Patient Education method: Explanation, Demonstration, and Handouts Education comprehension: verbalized understanding and returned demonstration  HOME EXERCISE PROGRAM: Eval: A/ROM, scapular A/ROM  GOALS: Goals reviewed with patient? Yes   SHORT TERM GOALS: Target date: 10/06/2022  Pt will be provided with and educated on HEP to improve mobility in RUE required for use during ADL completion.   Goal status: INITIAL  2.  Pt will increase RUE A/ROM by 5-10 degrees to improve ability to use RUE during dressing tasks with minimal compensatory techniques.   Goal status: INITIAL  3.  Pt will increase RUE strength to 5/5 to improve ability to reach for items at waist to chest height during bathing and  grooming tasks.   Goal status: INITIAL  4.  Pt will decrease RUE fascial restrictions to trace amounts to improve mobility required for reaching overhead and behind back during functional tasks.   Goal status: INITIAL  5.  Pt will decrease RUE pain to 3/10 or less to improve ability to sleep for 2+ consecutive hours without waking due to pain in the RUE.   Goal status: INITIAL    ASSESSMENT:  CLINICAL IMPRESSION: Patient is a 72 y.o. female who was seen today for occupational therapy evaluation for right shoulder pain. Pt presents with increased pain and fascial restrictions, decreased ROM, strength, and functional use of the RUE after a fall in March. Pt reports continued pain with certain movements, and limited ability to complete ADL tasks requiring reaching and lifting.    PERFORMANCE DEFICITS: in functional skills including in functional skills including ADLs, IADLs, coordination, tone, ROM, strength, pain, fascial restrictions, muscle spasms, and UE functional use  IMPAIRMENTS: are limiting patient from ADLs, IADLs, rest and sleep, and leisure.   COMORBIDITIES: may have co-morbidities  that affects occupational performance. Patient will benefit from skilled OT to address above impairments and improve overall function.  MODIFICATION OR ASSISTANCE TO COMPLETE EVALUATION: No modification of tasks or assist necessary to complete an evaluation.  OT OCCUPATIONAL PROFILE AND HISTORY: Problem focused assessment: Including review of records relating to presenting problem.  CLINICAL DECISION MAKING: LOW - limited treatment options, no task modification necessary  REHAB POTENTIAL: Good  EVALUATION COMPLEXITY: Low      PLAN:  OT FREQUENCY: 1x/week  OT DURATION: 6 weeks  PLANNED INTERVENTIONS: self care/ADL training, therapeutic exercise, therapeutic activity, neuromuscular re-education, manual therapy, passive range of motion, splinting, electrical stimulation, ultrasound,  moist heat, cryotherapy, patient/family education, and DME and/or AE instructions  RECOMMENDED OTHER SERVICES: None  CONSULTED AND AGREED WITH PLAN OF CARE: Patient  PLAN FOR NEXT SESSION: Follow up on HEP, initiate manual techniques, scapular theraband, shoulder stretches   Ezra Sites, OTR/L  [phone] 09/06/2022, 10:03 AM

## 2022-09-13 ENCOUNTER — Ambulatory Visit (HOSPITAL_COMMUNITY): Payer: Medicare Other | Admitting: Occupational Therapy

## 2022-09-13 ENCOUNTER — Encounter (HOSPITAL_COMMUNITY): Payer: Self-pay | Admitting: Occupational Therapy

## 2022-09-13 DIAGNOSIS — R29898 Other symptoms and signs involving the musculoskeletal system: Secondary | ICD-10-CM | POA: Diagnosis not present

## 2022-09-13 DIAGNOSIS — M25511 Pain in right shoulder: Secondary | ICD-10-CM

## 2022-09-13 NOTE — Therapy (Signed)
OUTPATIENT OCCUPATIONAL THERAPY ORTHO TREATMENT  Patient Name: Laurie Lamb MRN: [id number] DOB:February 28, 1951, 72 y.o., female Today's Date: 09/13/2022   END OF SESSION:  OT End of Session - 09/13/22 1014     Visit Number 2    Number of Visits 6    Date for OT Re-Evaluation 10/18/22    Authorization Type [employer] Medicare, $20 copay    Progress Note Due on Visit 10    OT Start Time [id number]    OT Stop Time 1015    OT Time Calculation (min) 44 min    Activity Tolerance Patient tolerated treatment well    Behavior During Therapy WFL for tasks assessed/performed              Past Medical History:  Diagnosis Date   Arthritis    Diabetes mellitus without complication ([facility])    Hypertension    Past Surgical History:  Procedure Laterality Date   APPENDECTOMY     CESAREAN SECTION     There are no problems to display for this patient.   PCP: Dr. Nita Sells REFERRING PROVIDER: Dr. Nita Sells  ONSET DATE: 05/15/22  REFERRING DIAG: pain in right shoulder  THERAPY DIAG:  Acute pain of right shoulder  Other symptoms and signs involving the musculoskeletal system  Rationale for Evaluation and Treatment: Rehabilitation  SUBJECTIVE:   SUBJECTIVE STATEMENT: S: I haven't seen much change.   PERTINENT HISTORY: Pt is a 72 y/o female presenting with right shoulder pain, present since March 16th when she fell down the steps while carrying mattress topper down the steps. Pt has not seen an orthopedic MD yet, is trying therapy first.   PRECAUTIONS: None  WEIGHT BEARING RESTRICTIONS: No  PAIN:  Are you having pain? No  FALLS: Has patient fallen in last 6 months? Yes. Number of falls 1  PLOF: Independent  PATIENT GOALS: To have less pain and improved use of the RUE  NEXT MD VISIT: December 2024  OBJECTIVE:   HAND DOMINANCE: Right  ADLs: Overall ADLs: Pt reports difficulty with reaching back, folding the covers back when getting into the bed. Pushing the door open, writing,  pulling or pushing items. Pt has difficulty sleeping on the right side and reaching up overhead.   FUNCTIONAL OUTCOME MEASURES: FOTO: 59/100  UPPER EXTREMITY ROM:       Assessed in sitting, er/IR adducted  Active ROM Right eval  Shoulder flexion 145  Shoulder abduction 146  Shoulder internal rotation 90  Shoulder external rotation 69  (Blank rows = not tested)    UPPER EXTREMITY MMT:     Assessed in sitting, er/IR adducted  MMT Right eval  Shoulder flexion 5/5  Shoulder abduction 4/5  Shoulder internal rotation 5/5  Shoulder external rotation 4+/5  (Blank rows = not tested)   SENSATION: Pt with mild tingling in upper arm down to elbow and forearm  OBSERVATIONS: min/mod fascial restrictions along right upper arm, anterior shoulder, and trapezius   TODAY'S TREATMENT:  DATE:  09/13/22 -Myofascial release to right upper arm, anterior shoulder, trapezius, and scapular regions to decrease pain and fascial restrictions and increase joint ROM -P/ROM: supine-flexion, abduction, er, horizontal abduction, 5 reps -Strengthening: supine, 1#-protraction, flexion, horizontal abduction, er, 10 reps -A/ROM: supine-abduction, 10 reps -Shoulder stretches: flexion, IR behind back with horizontal towel, doorway stretch, cross chest stretch, 2x10" holds -A/ROM: standing-protraction, flexion, abduction, horizontal abduction, er, 10 reps -Scapular theraband: red-row, extension, retraction, 10 reps -Overhead lacing: seated-lacing from top down then reversing -Ball on wall: 1' flexion, 1' abduction -UBE: level 2, 3' reverse, pace: 5.5 -Ball pass: behind back from right to left for IR work,10 reps  PATIENT EDUCATION: Education details: strengthening with low weight, shoulder stretches Person educated: Patient Education method: Programmer, multimedia, Demonstration, and  Handouts Education comprehension: verbalized understanding and returned demonstration  HOME EXERCISE PROGRAM: Eval: A/ROM, scapular A/ROM 7/15: strengthening with low weight, shoulder stretches   GOALS: Goals reviewed with patient? Yes   SHORT TERM GOALS: Target date: 10/06/2022  Pt will be provided with and educated on HEP to improve mobility in RUE required for use during ADL completion.   Goal status: IN PROGRESS  2.  Pt will increase RUE A/ROM by 5-10 degrees to improve ability to use RUE during dressing tasks with minimal compensatory techniques.   Goal status: IN PROGRESS  3.  Pt will increase RUE strength to 5/5 to improve ability to reach for items at waist to chest height during bathing and grooming tasks.   Goal status: IN PROGRESS  4.  Pt will decrease RUE fascial restrictions to trace amounts to improve mobility required for reaching overhead and behind back during functional tasks.   Goal status: IN PROGRESS  5.  Pt will decrease RUE pain to 3/10 or less to improve ability to sleep for 2+ consecutive hours without waking due to pain in the RUE.   Goal status: IN PROGRESS    ASSESSMENT:  CLINICAL IMPRESSION: Patient reports HEP is going well, has not noticed significant change at this time. Initiated myofascial release and passive stretching, progressed to strengthening in supine with exception of abduction during which returned to A/ROM. Added shoulder stretches, scapular theraband, and stability work today. Pt with intermittent difficulty in abduction and flexion planes, mild discomfort and weakness that resolved once exercise was completed. Verbal cuing for form and technique, rest breaks provided as needed. HEP updated for strengthening and shoulder stretches.   PERFORMANCE DEFICITS: in functional skills including in functional skills including ADLs, IADLs, coordination, tone, ROM, strength, pain, fascial restrictions, muscle spasms, and UE functional  use   PLAN:  OT FREQUENCY: 1x/week  OT DURATION: 6 weeks  PLANNED INTERVENTIONS: self care/ADL training, therapeutic exercise, therapeutic activity, neuromuscular re-education, manual therapy, passive range of motion, splinting, electrical stimulation, ultrasound, moist heat, cryotherapy, patient/family education, and DME and/or AE instructions   CONSULTED AND AGREED WITH PLAN OF CARE: Patient  PLAN FOR NEXT SESSION: Follow up on HEP, continue with manual techniques as needed, scapular theraband, shoulder stretches, A/ROM   Ezra Sites, OTR/L  [phone] 09/13/2022, 10:15 AM

## 2022-09-13 NOTE — Patient Instructions (Signed)
  1) Flexion Wall Stretch    Face wall, place affected handon wall in front of you. Slide hand up the wall  and lean body in towards the wall. Hold for 10 seconds. Repeat 3-5 times. 1-2 times/day.     2) Towel Stretch with Internal Rotation      Gently pull up (or to the side) your affected arm  behind your back with the assist of a towel. Hold 10 seconds, repeat 3-5 times. 1-2 times/day.             3) Corner Stretch    Stand at a corner of a wall, place your arms on the walls with elbows bent. Lean into the corner until a stretch is felt along the front of your chest and/or shoulders. Hold for 10 seconds. Repeat 3-5X, 1-2 times/day.    4) Posterior Capsule Stretch    Bring the involved arm across chest. Grasp elbow and pull toward chest until you feel a stretch in the back of the upper arm and shoulder. Hold 10 seconds. Repeat 3-5X. Complete 1-2 times/day.

## 2022-09-20 ENCOUNTER — Ambulatory Visit (HOSPITAL_COMMUNITY): Payer: Medicare Other | Admitting: Occupational Therapy

## 2022-09-20 ENCOUNTER — Encounter (HOSPITAL_COMMUNITY): Payer: Self-pay | Admitting: Occupational Therapy

## 2022-09-20 DIAGNOSIS — R29898 Other symptoms and signs involving the musculoskeletal system: Secondary | ICD-10-CM

## 2022-09-20 DIAGNOSIS — M25511 Pain in right shoulder: Secondary | ICD-10-CM | POA: Diagnosis not present

## 2022-09-20 NOTE — Therapy (Signed)
OUTPATIENT OCCUPATIONAL THERAPY ORTHO TREATMENT  Patient Name: Laurie Lamb MRN: [id number] DOB:[birth year], 72 y.o., female Today's Date: 09/20/2022   END OF SESSION:  OT End of Session - 09/20/22 1029     Visit Number 3    Number of Visits 6    Date for OT Re-Evaluation 10/18/22    Authorization Type [employer] Medicare, $20 copay    Progress Note Due on Visit 10    OT Start Time [phone]    OT Stop Time 1032    OT Time Calculation (min) 38 min    Activity Tolerance Patient tolerated treatment well    Behavior During Therapy WFL for tasks assessed/performed               Past Medical History:  Diagnosis Date   Arthritis    Diabetes mellitus without complication (HCC)    Hypertension    Past Surgical History:  Procedure Laterality Date   APPENDECTOMY     CESAREAN SECTION     There are no problems to display for this patient.   PCP: Dr. Nita Sells REFERRING PROVIDER: Dr. Nita Sells  ONSET DATE: 05/15/22  REFERRING DIAG: pain in right shoulder  THERAPY DIAG:  Acute pain of right shoulder  Other symptoms and signs involving the musculoskeletal system  Rationale for Evaluation and Treatment: Rehabilitation  SUBJECTIVE:   SUBJECTIVE STATEMENT: S: Everything seems to be improving.   PERTINENT HISTORY: Pt is a 72 y/o female presenting with right shoulder pain, present since March 16th when she fell down the steps while carrying mattress topper down the steps. Pt has not seen an orthopedic MD yet, is trying therapy first.   PRECAUTIONS: None  WEIGHT BEARING RESTRICTIONS: No  PAIN:  Are you having pain? No  FALLS: Has patient fallen in last 6 months? Yes. Number of falls 1  PLOF: Independent  PATIENT GOALS: To have less pain and improved use of the RUE  NEXT MD VISIT: December 2024  OBJECTIVE:   HAND DOMINANCE: Right  ADLs: Overall ADLs: Pt reports difficulty with reaching back, folding the covers back when getting into the bed. Pushing the door open,  writing, pulling or pushing items. Pt has difficulty sleeping on the right side and reaching up overhead.   FUNCTIONAL OUTCOME MEASURES: FOTO: 59/100  UPPER EXTREMITY ROM:       Assessed in sitting, er/IR adducted  Active ROM Right eval  Shoulder flexion 145  Shoulder abduction 146  Shoulder internal rotation 90  Shoulder external rotation 69  (Blank rows = not tested)    UPPER EXTREMITY MMT:     Assessed in sitting, er/IR adducted  MMT Right eval  Shoulder flexion 5/5  Shoulder abduction 4/5  Shoulder internal rotation 5/5  Shoulder external rotation 4+/5  (Blank rows = not tested)   SENSATION: Pt with mild tingling in upper arm down to elbow and forearm  OBSERVATIONS: min/mod fascial restrictions along right upper arm, anterior shoulder, and trapezius   TODAY'S TREATMENT:  DATE:  09/20/22 -Myofascial release to right upper arm, anterior shoulder, trapezius, and scapular regions to decrease pain and fascial restrictions and increase joint ROM -P/ROM: supine-flexion, abduction, er, horizontal abduction, 5 reps -Strengthening: supine, 2#-protraction, flexion, horizontal abduction, er, 10 reps -A/ROM: supine-abduction, 10 reps -Proximal shoulder strengthening: supine, 1#: paddles, criss cross, circles each direction, 10 reps each -Strengthening: standing, 1#-protraction, flexion, abduction, horizontal abduction, er, 10 reps -ABC writing: 1#, shoulder at 90 degrees flexion -Shoulder stretches: IR behind back with horizontal towel, 2x20" -Ball pass behind back for IR, 10 reps right to left; behind head right to left for er, 10 reps -Y lift off: 10 reps -X to V arms: 10 reps -Scapular theraband: red-row, extension, retraction, 10 reps -UBE: level 2, 2' forward, 2' reverse, pace: 8.0  09/13/22 -Myofascial release to right upper arm, anterior  shoulder, trapezius, and scapular regions to decrease pain and fascial restrictions and increase joint ROM -P/ROM: supine-flexion, abduction, er, horizontal abduction, 5 reps -Strengthening: supine, 1#-protraction, flexion, horizontal abduction, er, 10 reps -A/ROM: supine-abduction, 10 reps -Shoulder stretches: flexion, IR behind back with horizontal towel, doorway stretch, cross chest stretch, 2x10" holds -A/ROM: standing-protraction, flexion, abduction, horizontal abduction, er, 10 reps -Scapular theraband: red-row, extension, retraction, 10 reps -Overhead lacing: seated-lacing from top down then reversing -Ball on wall: 1' flexion, 1' abduction -UBE: level 2, 3' reverse, pace: 5.5 -Ball pass: behind back from right to left for IR work,10 reps   PATIENT EDUCATION: Education details: scapular theraband Person educated: Patient Education method: Explanation, Demonstration, and Handouts Education comprehension: verbalized understanding and returned demonstration  HOME EXERCISE PROGRAM: Eval: A/ROM, scapular A/ROM 7/15: strengthening with low weight, shoulder stretches   GOALS: Goals reviewed with patient? Yes   SHORT TERM GOALS: Target date: 10/06/2022  Pt will be provided with and educated on HEP to improve mobility in RUE required for use during ADL completion.   Goal status: IN PROGRESS  2.  Pt will increase RUE A/ROM by 5-10 degrees to improve ability to use RUE during dressing tasks with minimal compensatory techniques.   Goal status: IN PROGRESS  3.  Pt will increase RUE strength to 5/5 to improve ability to reach for items at waist to chest height during bathing and grooming tasks.   Goal status: IN PROGRESS  4.  Pt will decrease RUE fascial restrictions to trace amounts to improve mobility required for reaching overhead and behind back during functional tasks.   Goal status: IN PROGRESS  5.  Pt will decrease RUE pain to 3/10 or less to improve ability to sleep for  2+ consecutive hours without waking due to pain in the RUE.   Goal status: IN PROGRESS    ASSESSMENT:  CLINICAL IMPRESSION: Patient reports HEP is going well, everything seems to be improving, does have popping sometimes and er continues to be the most difficulty motion. Pt with minimal fascial restrictions in anterior shoulder and upper trapezius. ROM WFL during passive stretching and strengthening tasks. Increased weights to 2# in supine, added 1# weight in standing. Continued with IR/er work behind back, added scapular mobility tasks, continued with scapular theraband and added to HEP. Verbal cuing for form and technique, pt with good activity tolerance during tasks.   PERFORMANCE DEFICITS: in functional skills including in functional skills including ADLs, IADLs, coordination, tone, ROM, strength, pain, fascial restrictions, muscle spasms, and UE functional use   PLAN:  OT FREQUENCY: 1x/week  OT DURATION: 6 weeks  PLANNED INTERVENTIONS: self care/ADL training, therapeutic exercise, therapeutic activity, neuromuscular  re-education, manual therapy, passive range of motion, splinting, electrical stimulation, ultrasound, moist heat, cryotherapy, patient/family education, and DME and/or AE instructions   CONSULTED AND AGREED WITH PLAN OF CARE: Patient  PLAN FOR NEXT SESSION: Follow up on HEP, continue with manual techniques as needed, scapular theraband, shoulder stretches, shoulder strengthening and stability    Ezra Sites, OTR/L  [phone] 09/20/2022, 10:33 AM

## 2022-09-20 NOTE — Patient Instructions (Signed)

## 2022-09-27 ENCOUNTER — Encounter (HOSPITAL_COMMUNITY): Payer: Self-pay | Admitting: Occupational Therapy

## 2022-09-27 ENCOUNTER — Ambulatory Visit (HOSPITAL_COMMUNITY): Payer: Medicare Other | Admitting: Occupational Therapy

## 2022-09-27 DIAGNOSIS — R29898 Other symptoms and signs involving the musculoskeletal system: Secondary | ICD-10-CM

## 2022-09-27 DIAGNOSIS — M25511 Pain in right shoulder: Secondary | ICD-10-CM

## 2022-09-27 NOTE — Patient Instructions (Signed)

## 2022-09-27 NOTE — Therapy (Signed)
OUTPATIENT OCCUPATIONAL THERAPY ORTHO TREATMENT  Patient Name: Laurie Lamb MRN: [id number] DOB:[birth year], 72 y.o., female Today's Date: 09/27/2022   END OF SESSION:  OT End of Session - 09/27/22 1025     Visit Number 4    Number of Visits 6    Date for OT Re-Evaluation 10/18/22    Authorization Type [employer] Medicare, $20 copay    Progress Note Due on Visit 10    OT Start Time 0950    OT Stop Time 1030    OT Time Calculation (min) 40 min    Activity Tolerance Patient tolerated treatment well    Behavior During Therapy WFL for tasks assessed/performed                Past Medical History:  Diagnosis Date   Arthritis    Diabetes mellitus without complication (HCC)    Hypertension    Past Surgical History:  Procedure Laterality Date   APPENDECTOMY     CESAREAN SECTION     There are no problems to display for this patient.   PCP: Dr. Nita Sells REFERRING PROVIDER: Dr. Nita Sells  ONSET DATE: 05/15/22  REFERRING DIAG: pain in right shoulder  THERAPY DIAG:  Acute pain of right shoulder  Other symptoms and signs involving the musculoskeletal system  Rationale for Evaluation and Treatment: Rehabilitation  SUBJECTIVE:   SUBJECTIVE STATEMENT: S: Still hurts when I try to go behind my back.  PERTINENT HISTORY: Pt is a 72 y/o female presenting with right shoulder pain, present since March 16th when she fell down the steps while carrying mattress topper down the steps. Pt has not seen an orthopedic MD yet, is trying therapy first.   PRECAUTIONS: None  WEIGHT BEARING RESTRICTIONS: No  PAIN:  Are you having pain? No  FALLS: Has patient fallen in last 6 months? Yes. Number of falls 1  PLOF: Independent  PATIENT GOALS: To have less pain and improved use of the RUE  NEXT MD VISIT: December 2024  OBJECTIVE:   HAND DOMINANCE: Right  ADLs: Overall ADLs: Pt reports difficulty with reaching back, folding the covers back when getting into the bed. Pushing the  door open, writing, pulling or pushing items. Pt has difficulty sleeping on the right side and reaching up overhead.   FUNCTIONAL OUTCOME MEASURES: FOTO: 59/100  UPPER EXTREMITY ROM:       Assessed in sitting, er/IR adducted  Active ROM Right eval  Shoulder flexion 145  Shoulder abduction 146  Shoulder internal rotation 90  Shoulder external rotation 69  (Blank rows = not tested)    UPPER EXTREMITY MMT:     Assessed in sitting, er/IR adducted  MMT Right eval  Shoulder flexion 5/5  Shoulder abduction 4/5  Shoulder internal rotation 5/5  Shoulder external rotation 4+/5  (Blank rows = not tested)   SENSATION: Pt with mild tingling in upper arm down to elbow and forearm  OBSERVATIONS: min/mod fascial restrictions along right upper arm, anterior shoulder, and trapezius   TODAY'S TREATMENT:  DATE:  09/27/22 -Myofascial release to right upper arm, anterior shoulder, trapezius, and scapular regions to decrease pain and fascial restrictions and increase joint ROM -P/ROM: supine-flexion, abduction, er, horizontal abduction, 5 reps -Strengthening: supine, 2#-protraction, flexion, horizontal abduction, er, 10 reps; abduction 1#, 10 reps -Proximal shoulder strengthening: supine, 2#: paddles, criss cross, circles each direction, 10 reps each -Y lift off: 10 reps -Shoulder stretches: IR behind back with horizontal towel, 3x20" -Ball pass behind back for IR, 10 reps right to left; behind head right to left for er, 10 reps -X to V arms: 1#-10 reps -Ball on wall: 1' flexion, 1' abduction -ABC writing: 1#, shoulder at 90 degrees flexion -Strengthening: standing, 1#-protraction, flexion, abduction, horizontal abduction, er, 10 reps -Overhead lacing: 1# wrist weight-seated, lacing from top down then reversing -UBE: level 2, 3' forward, 3' reverse, pace:  9.0  09/20/22 -Myofascial release to right upper arm, anterior shoulder, trapezius, and scapular regions to decrease pain and fascial restrictions and increase joint ROM -P/ROM: supine-flexion, abduction, er, horizontal abduction, 5 reps -Strengthening: supine, 2#-protraction, flexion, horizontal abduction, er, 10 reps -A/ROM: supine-abduction, 10 reps -Proximal shoulder strengthening: supine, 1#: paddles, criss cross, circles each direction, 10 reps each -Strengthening: standing, 1#-protraction, flexion, abduction, horizontal abduction, er, 10 reps -ABC writing: 1#, shoulder at 90 degrees flexion -Shoulder stretches: IR behind back with horizontal towel, 2x20" -Ball pass behind back for IR, 10 reps right to left; behind head right to left for er, 10 reps -Y lift off: 10 reps -X to V arms: 10 reps -Scapular theraband: red-row, extension, retraction, 10 reps -UBE: level 2, 2' forward, 2' reverse, pace: 8.0  09/13/22 -Myofascial release to right upper arm, anterior shoulder, trapezius, and scapular regions to decrease pain and fascial restrictions and increase joint ROM -P/ROM: supine-flexion, abduction, er, horizontal abduction, 5 reps -Strengthening: supine, 1#-protraction, flexion, horizontal abduction, er, 10 reps -A/ROM: supine-abduction, 10 reps -Shoulder stretches: flexion, IR behind back with horizontal towel, doorway stretch, cross chest stretch, 2x10" holds -A/ROM: standing-protraction, flexion, abduction, horizontal abduction, er, 10 reps -Scapular theraband: red-row, extension, retraction, 10 reps -Overhead lacing: seated-lacing from top down then reversing -Ball on wall: 1' flexion, 1' abduction -UBE: level 2, 3' reverse, pace: 5.5 -Ball pass: behind back from right to left for IR work,10 reps   PATIENT EDUCATION: Education details: scapular theraband Person educated: Patient Education method: Explanation, Demonstration, and Handouts Education comprehension: verbalized  understanding and returned demonstration  HOME EXERCISE PROGRAM: Eval: A/ROM, scapular A/ROM 7/15: strengthening with low weight, shoulder stretches 7/29: scapular theraband-red   GOALS: Goals reviewed with patient? Yes   SHORT TERM GOALS: Target date: 10/06/2022  Pt will be provided with and educated on HEP to improve mobility in RUE required for use during ADL completion.   Goal status: IN PROGRESS  2.  Pt will increase RUE A/ROM by 5-10 degrees to improve ability to use RUE during dressing tasks with minimal compensatory techniques.   Goal status: IN PROGRESS  3.  Pt will increase RUE strength to 5/5 to improve ability to reach for items at waist to chest height during bathing and grooming tasks.   Goal status: IN PROGRESS  4.  Pt will decrease RUE fascial restrictions to trace amounts to improve mobility required for reaching overhead and behind back during functional tasks.   Goal status: IN PROGRESS  5.  Pt will decrease RUE pain to 3/10 or less to improve ability to sleep for 2+ consecutive hours without waking due to pain in the RUE.  Goal status: IN PROGRESS    ASSESSMENT:  CLINICAL IMPRESSION: Patient reports she has been completing her HEP. Continued with manual techniques, pt with full ROM during passive stretching. Continued with strengthening and stability work in supine and standing, IR stretching. Pt with good ROM for er behind head, continues to be limited with IR behind back. Pt with occasional popping, but no pain associated. Added ball on wall and overhead lacing, pt with improving activity tolerance compared to prior sessions. Verbal cuing for form and technique.   PERFORMANCE DEFICITS: in functional skills including in functional skills including ADLs, IADLs, coordination, tone, ROM, strength, pain, fascial restrictions, muscle spasms, and UE functional use   PLAN:  OT FREQUENCY: 1x/week  OT DURATION: 6 weeks  PLANNED INTERVENTIONS: self care/ADL  training, therapeutic exercise, therapeutic activity, neuromuscular re-education, manual therapy, passive range of motion, splinting, electrical stimulation, ultrasound, moist heat, cryotherapy, patient/family education, and DME and/or AE instructions   CONSULTED AND AGREED WITH PLAN OF CARE: Patient  PLAN FOR NEXT SESSION: Follow up on HEP, continue with manual techniques as needed, scapular theraband, shoulder stretches, shoulder strengthening and stability    Ezra Sites, OTR/L  [phone] 09/27/2022, 10:30 AM

## 2022-10-04 ENCOUNTER — Encounter (HOSPITAL_COMMUNITY): Payer: Self-pay | Admitting: Occupational Therapy

## 2022-10-04 ENCOUNTER — Ambulatory Visit (HOSPITAL_COMMUNITY): Payer: Medicare Other | Attending: Internal Medicine | Admitting: Occupational Therapy

## 2022-10-04 DIAGNOSIS — M25511 Pain in right shoulder: Secondary | ICD-10-CM | POA: Insufficient documentation

## 2022-10-04 DIAGNOSIS — R29898 Other symptoms and signs involving the musculoskeletal system: Secondary | ICD-10-CM | POA: Insufficient documentation

## 2022-10-04 NOTE — Therapy (Signed)
OUTPATIENT OCCUPATIONAL THERAPY ORTHO TREATMENT  Patient Name: Laurie Lamb MRN: [id number] DOB:04-03-1950, 72 y.o., female Today's Date: 10/04/2022   END OF SESSION:  OT End of Session - 10/04/22 1040     Visit Number 5    Number of Visits 6    Date for OT Re-Evaluation 10/18/22    Authorization Type [employer] Medicare, $20 copay    Progress Note Due on Visit 10    OT Start Time [phone]    OT Stop Time 1027    OT Time Calculation (min) 39 min    Activity Tolerance Patient tolerated treatment well    Behavior During Therapy WFL for tasks assessed/performed                 Past Medical History:  Diagnosis Date   Arthritis    Diabetes mellitus without complication (HCC)    Hypertension    Past Surgical History:  Procedure Laterality Date   APPENDECTOMY     CESAREAN SECTION     There are no problems to display for this patient.   PCP: Dr. Nita Sells REFERRING PROVIDER: Dr. Nita Sells  ONSET DATE: 05/15/22  REFERRING DIAG: pain in right shoulder  THERAPY DIAG:  Acute pain of right shoulder  Other symptoms and signs involving the musculoskeletal system  Rationale for Evaluation and Treatment: Rehabilitation  SUBJECTIVE:   SUBJECTIVE STATEMENT: S: It's doing ok, going behind the back is still hard.   PERTINENT HISTORY: Pt is a 72 y/o female presenting with right shoulder pain, present since March 16th when she fell down the steps while carrying mattress topper down the steps. Pt has not seen an orthopedic MD yet, is trying therapy first.   PRECAUTIONS: None  WEIGHT BEARING RESTRICTIONS: No  PAIN:  Are you having pain? No  FALLS: Has patient fallen in last 6 months? Yes. Number of falls 1  PLOF: Independent  PATIENT GOALS: To have less pain and improved use of the RUE  NEXT MD VISIT: December 2024  OBJECTIVE:   HAND DOMINANCE: Right  ADLs: Overall ADLs: Pt reports difficulty with reaching back, folding the covers back when getting into the bed.  Pushing the door open, writing, pulling or pushing items. Pt has difficulty sleeping on the right side and reaching up overhead.   FUNCTIONAL OUTCOME MEASURES: FOTO: 59/100  UPPER EXTREMITY ROM:       Assessed in sitting, er/IR adducted  Active ROM Right eval  Shoulder flexion 145  Shoulder abduction 146  Shoulder internal rotation 90  Shoulder external rotation 69  (Blank rows = not tested)    UPPER EXTREMITY MMT:     Assessed in sitting, er/IR adducted  MMT Right eval  Shoulder flexion 5/5  Shoulder abduction 4/5  Shoulder internal rotation 5/5  Shoulder external rotation 4+/5  (Blank rows = not tested)   SENSATION: Pt with mild tingling in upper arm down to elbow and forearm  OBSERVATIONS: min/mod fascial restrictions along right upper arm, anterior shoulder, and trapezius   TODAY'S TREATMENT:  DATE:  10/04/22 -Myofascial release to right upper arm, anterior shoulder, trapezius, and scapular regions to decrease pain and fascial restrictions and increase joint ROM -P/ROM: supine-flexion, abduction, er, horizontal abduction, 5 reps -Strengthening: supine, 2#-protraction, flexion, horizontal abduction, er, 10 reps; abduction 1#, 10 reps -Proximal shoulder strengthening: supine, 2#: paddles, criss cross, circles each direction, 10 reps each -Shoulder stretches: IR behind back with horizontal towel, 3x30" -Ball pass behind back for IR, 10 reps right to left, 10 reps -Theraband strengthening: green-protraction, flexion, abduction, er, IR, horizontal abduction, 10 reps -Ball on wall: 1' flexion, 1' abduction -Overhead lacing: 1# wrist weight-seated, lacing from top down then reversing -ABC writing: 1# wrist weight, shoulder at 90 degrees flexion  09/27/22 -Myofascial release to right upper arm, anterior shoulder, trapezius, and scapular regions to  decrease pain and fascial restrictions and increase joint ROM -P/ROM: supine-flexion, abduction, er, horizontal abduction, 5 reps -Strengthening: supine, 2#-protraction, flexion, horizontal abduction, er, 10 reps; abduction 1#, 10 reps -Proximal shoulder strengthening: supine, 2#: paddles, criss cross, circles each direction, 10 reps each -Y lift off: 10 reps -Shoulder stretches: IR behind back with horizontal towel, 3x20" -Ball pass behind back for IR, 10 reps right to left; behind head right to left for er, 10 reps -X to V arms: 1#-10 reps -Ball on wall: 1' flexion, 1' abduction -ABC writing: 1#, shoulder at 90 degrees flexion -Strengthening: standing, 1#-protraction, flexion, abduction, horizontal abduction, er, 10 reps -Overhead lacing: 1# wrist weight-seated, lacing from top down then reversing -UBE: level 2, 3' forward, 3' reverse, pace: 9.0  09/20/22 -Myofascial release to right upper arm, anterior shoulder, trapezius, and scapular regions to decrease pain and fascial restrictions and increase joint ROM -P/ROM: supine-flexion, abduction, er, horizontal abduction, 5 reps -Strengthening: supine, 2#-protraction, flexion, horizontal abduction, er, 10 reps -A/ROM: supine-abduction, 10 reps -Proximal shoulder strengthening: supine, 1#: paddles, criss cross, circles each direction, 10 reps each -Strengthening: standing, 1#-protraction, flexion, abduction, horizontal abduction, er, 10 reps -ABC writing: 1#, shoulder at 90 degrees flexion -Shoulder stretches: IR behind back with horizontal towel, 2x20" -Ball pass behind back for IR, 10 reps right to left; behind head right to left for er, 10 reps -Y lift off: 10 reps -X to V arms: 10 reps -Scapular theraband: red-row, extension, retraction, 10 reps -UBE: level 2, 2' forward, 2' reverse, pace: 8.0    PATIENT EDUCATION: Education details: reviewed HEP Person educated: Patient Education method: Explanation, Demonstration, and  Handouts Education comprehension: verbalized understanding and returned demonstration  HOME EXERCISE PROGRAM: Eval: A/ROM, scapular A/ROM 7/15: strengthening with low weight, shoulder stretches 7/29: scapular theraband-red   GOALS: Goals reviewed with patient? Yes   SHORT TERM GOALS: Target date: 10/06/2022  Pt will be provided with and educated on HEP to improve mobility in RUE required for use during ADL completion.   Goal status: IN PROGRESS  2.  Pt will increase RUE A/ROM by 5-10 degrees to improve ability to use RUE during dressing tasks with minimal compensatory techniques.   Goal status: IN PROGRESS  3.  Pt will increase RUE strength to 5/5 to improve ability to reach for items at waist to chest height during bathing and grooming tasks.   Goal status: IN PROGRESS  4.  Pt will decrease RUE fascial restrictions to trace amounts to improve mobility required for reaching overhead and behind back during functional tasks.   Goal status: IN PROGRESS  5.  Pt will decrease RUE pain to 3/10 or less to improve ability to sleep for 2+ consecutive  hours without waking due to pain in the RUE.   Goal status: IN PROGRESS    ASSESSMENT:  CLINICAL IMPRESSION: Patient reports she has been completing her HEP, continues to have difficulty with er behind back. Continued with manual techniques, pt with full ROM during passive and active ROM. Continued with strengthening in supine with 2# and 1# weights, proximal shoulder strengthening and scapular stability. Added theraband strengthening, pt with min fatigue. Verbal cuing for form and technique.   PERFORMANCE DEFICITS: in functional skills including in functional skills including ADLs, IADLs, coordination, tone, ROM, strength, pain, fascial restrictions, muscle spasms, and UE functional use   PLAN:  OT FREQUENCY: 1x/week  OT DURATION: 6 weeks  PLANNED INTERVENTIONS: self care/ADL training, therapeutic exercise, therapeutic activity,  neuromuscular re-education, manual therapy, passive range of motion, splinting, electrical stimulation, ultrasound, moist heat, cryotherapy, patient/family education, and DME and/or AE instructions   CONSULTED AND AGREED WITH PLAN OF CARE: Patient  PLAN FOR NEXT SESSION: Reassessment   Ezra Sites, OTR/L  [phone] 10/04/2022, 10:40 AM

## 2022-10-11 ENCOUNTER — Ambulatory Visit (HOSPITAL_COMMUNITY): Payer: Medicare Other | Admitting: Occupational Therapy

## 2022-10-11 ENCOUNTER — Encounter (HOSPITAL_COMMUNITY): Payer: Self-pay | Admitting: Occupational Therapy

## 2022-10-11 DIAGNOSIS — R29898 Other symptoms and signs involving the musculoskeletal system: Secondary | ICD-10-CM | POA: Diagnosis not present

## 2022-10-11 DIAGNOSIS — M25511 Pain in right shoulder: Secondary | ICD-10-CM

## 2022-10-11 NOTE — Patient Instructions (Signed)

## 2022-10-11 NOTE — Therapy (Signed)
OUTPATIENT OCCUPATIONAL THERAPY ORTHO TREATMENT & REASSESSMENT DISCHARGE SUMMARY  Patient Name: Laurie Lamb MRN: [id number] DOB:1950/04/10, 72 y.o., female Today's Date: 10/11/2022   END OF SESSION:  OT End of Session - 10/11/22 1018     Visit Number 6    Number of Visits 6    Date for OT Re-Evaluation 10/18/22    Authorization Type [employer] Medicare, $20 copay    Progress Note Due on Visit 10    OT Start Time [phone]    OT Stop Time 1013    OT Time Calculation (min) 27 min    Activity Tolerance Patient tolerated treatment well    Behavior During Therapy WFL for tasks assessed/performed                  Past Medical History:  Diagnosis Date   Arthritis    Diabetes mellitus without complication (HCC)    Hypertension    Past Surgical History:  Procedure Laterality Date   APPENDECTOMY     CESAREAN SECTION     There are no problems to display for this patient.   PCP: Dr. Nita Sells REFERRING PROVIDER: Dr. Nita Sells  ONSET DATE: 05/15/22  REFERRING DIAG: pain in right shoulder  THERAPY DIAG:  Acute pain of right shoulder  Other symptoms and signs involving the musculoskeletal system  Rationale for Evaluation and Treatment: Rehabilitation  SUBJECTIVE:   SUBJECTIVE STATEMENT: S: I can open my big pantry door that I couldn't open before.   PERTINENT HISTORY: Pt is a 72 y/o female presenting with right shoulder pain, present since March 16th when she fell down the steps while carrying mattress topper down the steps. Pt has not seen an orthopedic MD yet, is trying therapy first.   PRECAUTIONS: None  WEIGHT BEARING RESTRICTIONS: No  PAIN:  Are you having pain? No  FALLS: Has patient fallen in last 6 months? Yes. Number of falls 1  PLOF: Independent  PATIENT GOALS: To have less pain and improved use of the RUE  NEXT MD VISIT: December 2024  OBJECTIVE:   HAND DOMINANCE: Right  ADLs: Overall ADLs: Pt reports difficulty with reaching back, folding  the covers back when getting into the bed. Pushing the door open, writing, pulling or pushing items. Pt has difficulty sleeping on the right side and reaching up overhead.   FUNCTIONAL OUTCOME MEASURES: FOTO: 59/100 8/12: 77/100  UPPER EXTREMITY ROM:       Assessed in sitting, er/IR adducted  Active ROM Right eval Right 10/11/22  Shoulder flexion 145 145  Shoulder abduction 146 164  Shoulder internal rotation 90 90  Shoulder external rotation 69 72  (Blank rows = not tested)    UPPER EXTREMITY MMT:     Assessed in sitting, er/IR adducted  MMT Right eval Right 10/11/22  Shoulder flexion 5/5 5/5  Shoulder abduction 4/5 5/5  Shoulder internal rotation 5/5 5/5  Shoulder external rotation 4+/5 5/5  (Blank rows = not tested)   SENSATION: Pt with mild tingling in upper arm down to elbow and forearm  OBSERVATIONS: min/mod fascial restrictions along right upper arm, anterior shoulder, and trapezius   TODAY'S TREATMENT:  DATE:  10/11/22 -Myofascial release to right upper arm, anterior shoulder, trapezius, and scapular regions to decrease pain and fascial restrictions and increase joint ROM -P/ROM: supine-flexion, abduction, er, horizontal abduction, 5 reps -Theraband strengthening: green-protraction, flexion, abduction, er, IR, horizontal abduction, 10 reps  10/04/22 -Myofascial release to right upper arm, anterior shoulder, trapezius, and scapular regions to decrease pain and fascial restrictions and increase joint ROM -P/ROM: supine-flexion, abduction, er, horizontal abduction, 5 reps -Strengthening: supine, 2#-protraction, flexion, horizontal abduction, er, 10 reps; abduction 1#, 10 reps -Proximal shoulder strengthening: supine, 2#: paddles, criss cross, circles each direction, 10 reps each -Shoulder stretches: IR behind back with horizontal towel,  3x30" -Ball pass behind back for IR, 10 reps right to left, 10 reps -Theraband strengthening: green-protraction, flexion, abduction, er, IR, horizontal abduction, 10 reps -Ball on wall: 1' flexion, 1' abduction -Overhead lacing: 1# wrist weight-seated, lacing from top down then reversing -ABC writing: 1# wrist weight, shoulder at 90 degrees flexion  09/27/22 -Myofascial release to right upper arm, anterior shoulder, trapezius, and scapular regions to decrease pain and fascial restrictions and increase joint ROM -P/ROM: supine-flexion, abduction, er, horizontal abduction, 5 reps -Strengthening: supine, 2#-protraction, flexion, horizontal abduction, er, 10 reps; abduction 1#, 10 reps -Proximal shoulder strengthening: supine, 2#: paddles, criss cross, circles each direction, 10 reps each -Y lift off: 10 reps -Shoulder stretches: IR behind back with horizontal towel, 3x20" -Ball pass behind back for IR, 10 reps right to left; behind head right to left for er, 10 reps -X to V arms: 1#-10 reps -Ball on wall: 1' flexion, 1' abduction -ABC writing: 1#, shoulder at 90 degrees flexion -Strengthening: standing, 1#-protraction, flexion, abduction, horizontal abduction, er, 10 reps -Overhead lacing: 1# wrist weight-seated, lacing from top down then reversing -UBE: level 2, 3' forward, 3' reverse, pace: 9.0    PATIENT EDUCATION: Education details: red theraband strengthening Person educated: Patient Education method: Explanation, Demonstration, and Handouts Education comprehension: verbalized understanding and returned demonstration  HOME EXERCISE PROGRAM: Eval: A/ROM, scapular A/ROM 7/15: strengthening with low weight, shoulder stretches 7/29: scapular theraband-red 8/12: red theraband strengthening   GOALS: Goals reviewed with patient? Yes   SHORT TERM GOALS: Target date: 10/06/2022  Pt will be provided with and educated on HEP to improve mobility in RUE required for use during ADL  completion.   Goal status: MET  2.  Pt will increase RUE A/ROM by 5-10 degrees to improve ability to use RUE during dressing tasks with minimal compensatory techniques.   Goal status: MET  3.  Pt will increase RUE strength to 5/5 to improve ability to reach for items at waist to chest height during bathing and grooming tasks.   Goal status: MET  4.  Pt will decrease RUE fascial restrictions to trace amounts to improve mobility required for reaching overhead and behind back during functional tasks.   Goal status: MET  5.  Pt will decrease RUE pain to 3/10 or less to improve ability to sleep for 2+ consecutive hours without waking due to pain in the RUE.   Goal status: MET    ASSESSMENT:  CLINICAL IMPRESSION: Reassessment completed this session, pt reports improved functioning in her shoulder and ability to open doors, complete daily tasks. Pt has met 5/5 goals with improvement in pain, fascial restrictions, ROM, and strength in the RUE. Pt is completing RUE strengthening, educated on HEP for maintenance now. Pt agreeable to discharge today.   PERFORMANCE DEFICITS: in functional skills including in functional skills including ADLs, IADLs, coordination, tone,  ROM, strength, pain, fascial restrictions, muscle spasms, and UE functional use   PLAN:  OT FREQUENCY: 1x/week  OT DURATION: 6 weeks  PLANNED INTERVENTIONS: self care/ADL training, therapeutic exercise, therapeutic activity, neuromuscular re-education, manual therapy, passive range of motion, splinting, electrical stimulation, ultrasound, moist heat, cryotherapy, patient/family education, and DME and/or AE instructions   CONSULTED AND AGREED WITH PLAN OF CARE: Patient  PLAN FOR NEXT SESSION: N/A-discharge today   OCCUPATIONAL THERAPY DISCHARGE SUMMARY  Visits from Start of Care: 6  Current functional level related to goals / functional outcomes: See above. Pt has met all goals, demonstrates improved RUE functioning  required for ADLs and housework tasks.    Remaining deficits:  Intermittent soreness in RUE   Education / Equipment: HEP for strengthening   Patient agrees to discharge. Patient goals were met. Patient is being discharged due to meeting the stated rehab goals.Ezra Sites, OTR/L  [phone] 10/11/2022, 10:19 AM

## 2022-12-13 DIAGNOSIS — E782 Mixed hyperlipidemia: Secondary | ICD-10-CM | POA: Diagnosis not present

## 2022-12-13 DIAGNOSIS — E1143 Type 2 diabetes mellitus with diabetic autonomic (poly)neuropathy: Secondary | ICD-10-CM | POA: Diagnosis not present

## 2022-12-17 DIAGNOSIS — G720 Drug-induced myopathy: Secondary | ICD-10-CM | POA: Diagnosis not present

## 2022-12-17 DIAGNOSIS — I10 Essential (primary) hypertension: Secondary | ICD-10-CM | POA: Diagnosis not present

## 2022-12-17 DIAGNOSIS — R944 Abnormal results of kidney function studies: Secondary | ICD-10-CM | POA: Diagnosis not present

## 2022-12-17 DIAGNOSIS — M8580 Other specified disorders of bone density and structure, unspecified site: Secondary | ICD-10-CM | POA: Diagnosis not present

## 2022-12-17 DIAGNOSIS — E782 Mixed hyperlipidemia: Secondary | ICD-10-CM | POA: Diagnosis not present

## 2022-12-17 DIAGNOSIS — Z0001 Encounter for general adult medical examination with abnormal findings: Secondary | ICD-10-CM | POA: Diagnosis not present

## 2022-12-17 DIAGNOSIS — E1165 Type 2 diabetes mellitus with hyperglycemia: Secondary | ICD-10-CM | POA: Diagnosis not present

## 2022-12-17 DIAGNOSIS — M791 Myalgia, unspecified site: Secondary | ICD-10-CM | POA: Diagnosis not present

## 2022-12-17 DIAGNOSIS — J309 Allergic rhinitis, unspecified: Secondary | ICD-10-CM | POA: Diagnosis not present

## 2022-12-17 DIAGNOSIS — E1143 Type 2 diabetes mellitus with diabetic autonomic (poly)neuropathy: Secondary | ICD-10-CM | POA: Diagnosis not present

## 2022-12-17 DIAGNOSIS — M25511 Pain in right shoulder: Secondary | ICD-10-CM | POA: Diagnosis not present

## 2022-12-17 DIAGNOSIS — M542 Cervicalgia: Secondary | ICD-10-CM | POA: Diagnosis not present

## 2023-01-24 DIAGNOSIS — E119 Type 2 diabetes mellitus without complications: Secondary | ICD-10-CM | POA: Diagnosis not present

## 2023-02-25 ENCOUNTER — Other Ambulatory Visit (HOSPITAL_COMMUNITY): Payer: Self-pay | Admitting: Internal Medicine

## 2023-02-25 DIAGNOSIS — Z1231 Encounter for screening mammogram for malignant neoplasm of breast: Secondary | ICD-10-CM

## 2023-03-14 ENCOUNTER — Ambulatory Visit (HOSPITAL_COMMUNITY): Payer: Medicare Other

## 2023-03-21 ENCOUNTER — Ambulatory Visit (HOSPITAL_COMMUNITY)
Admission: RE | Admit: 2023-03-21 | Discharge: 2023-03-21 | Disposition: A | Discharge status: Home / Self Care | Payer: Medicare Other | Source: Ambulatory Visit | Attending: Internal Medicine | Admitting: Internal Medicine

## 2023-03-21 ENCOUNTER — Encounter (HOSPITAL_COMMUNITY): Payer: Self-pay

## 2023-03-21 DIAGNOSIS — Z1231 Encounter for screening mammogram for malignant neoplasm of breast: Secondary | ICD-10-CM | POA: Insufficient documentation

## 2023-03-23 DIAGNOSIS — E1143 Type 2 diabetes mellitus with diabetic autonomic (poly)neuropathy: Secondary | ICD-10-CM | POA: Diagnosis not present

## 2023-03-23 DIAGNOSIS — E782 Mixed hyperlipidemia: Secondary | ICD-10-CM | POA: Diagnosis not present

## 2023-03-29 DIAGNOSIS — Z79899 Other long term (current) drug therapy: Secondary | ICD-10-CM | POA: Diagnosis not present

## 2023-03-29 DIAGNOSIS — M8580 Other specified disorders of bone density and structure, unspecified site: Secondary | ICD-10-CM | POA: Diagnosis not present

## 2023-03-29 DIAGNOSIS — M25511 Pain in right shoulder: Secondary | ICD-10-CM | POA: Diagnosis not present

## 2023-03-29 DIAGNOSIS — J309 Allergic rhinitis, unspecified: Secondary | ICD-10-CM | POA: Diagnosis not present

## 2023-03-29 DIAGNOSIS — R42 Dizziness and giddiness: Secondary | ICD-10-CM | POA: Diagnosis not present

## 2023-03-29 DIAGNOSIS — E782 Mixed hyperlipidemia: Secondary | ICD-10-CM | POA: Diagnosis not present

## 2023-03-29 DIAGNOSIS — G720 Drug-induced myopathy: Secondary | ICD-10-CM | POA: Diagnosis not present

## 2023-03-29 DIAGNOSIS — M5441 Lumbago with sciatica, right side: Secondary | ICD-10-CM | POA: Diagnosis not present

## 2023-03-29 DIAGNOSIS — E1140 Type 2 diabetes mellitus with diabetic neuropathy, unspecified: Secondary | ICD-10-CM | POA: Diagnosis not present

## 2023-03-29 DIAGNOSIS — I10 Essential (primary) hypertension: Secondary | ICD-10-CM | POA: Diagnosis not present

## 2023-03-29 DIAGNOSIS — R944 Abnormal results of kidney function studies: Secondary | ICD-10-CM | POA: Diagnosis not present

## 2023-03-29 DIAGNOSIS — E1143 Type 2 diabetes mellitus with diabetic autonomic (poly)neuropathy: Secondary | ICD-10-CM | POA: Diagnosis not present

## 2023-06-14 ENCOUNTER — Telehealth: Payer: Self-pay

## 2023-06-14 NOTE — Progress Notes (Signed)
   06/14/2023  Patient ID: Laurie Lamb, female   DOB: [birth year], 73 y.o.   MRN: [id number]   Patient appeared on insurance report for not passing the quality metrics in 2024:  Medication Adherence for Diabetes (MAD)   Outreach to the patient was not needed today.  Meds Tracking:  -Glimepiride 1 mg - Last fill 30DS on 06/13/23, A1C 6.4 on 03/23/23. Qualifies for metric this year, next fill due 07/13/23 -Metformin ER 500 mg - Last fill 100DS on 04/06/23, next fill due 07/15/23.  -Valsartan-hydrochlorothiazide 160/25 mg - Last fill 90DS on 06/10/23, BP 132/68 on 03/29/23. Qualifies for metric this year, next fill due 09/08/23.  Plan:  Chronic conditions well controlled and up to date on medications, will reach out prior to next glimepiride fill to see if patient would like 100DS of this as well. Scheduled outreach 07/06/23.    Flint Hummer, PharmD

## 2023-07-11 ENCOUNTER — Telehealth: Payer: Self-pay

## 2023-07-11 NOTE — Progress Notes (Signed)
   07/11/2023  Patient ID: Laurie Lamb, female   DOB: October 22, 1950, 73 y.o.   MRN: [id number]  Medication Adherence:  Documentation now in [employer].  Flint Hummer, PharmD

## 2023-07-18 ENCOUNTER — Telehealth: Payer: Self-pay

## 2023-07-18 NOTE — Progress Notes (Signed)
   07/18/2023  Patient ID: Laurie Lamb, female   DOB: 1950-11-09, 73 y.o.   MRN: [id number]  Adherence Monitoring:  Up to date on meds, documentation in [employer].   Flint Hummer, PharmD

## 2023-07-22 DIAGNOSIS — Z13820 Encounter for screening for osteoporosis: Secondary | ICD-10-CM | POA: Diagnosis not present

## 2023-07-22 DIAGNOSIS — E782 Mixed hyperlipidemia: Secondary | ICD-10-CM | POA: Diagnosis not present

## 2023-07-22 DIAGNOSIS — M8580 Other specified disorders of bone density and structure, unspecified site: Secondary | ICD-10-CM | POA: Diagnosis not present

## 2023-07-22 DIAGNOSIS — E1143 Type 2 diabetes mellitus with diabetic autonomic (poly)neuropathy: Secondary | ICD-10-CM | POA: Diagnosis not present

## 2023-07-29 DIAGNOSIS — M5441 Lumbago with sciatica, right side: Secondary | ICD-10-CM | POA: Diagnosis not present

## 2023-07-29 DIAGNOSIS — M25511 Pain in right shoulder: Secondary | ICD-10-CM | POA: Diagnosis not present

## 2023-07-29 DIAGNOSIS — I10 Essential (primary) hypertension: Secondary | ICD-10-CM | POA: Diagnosis not present

## 2023-07-29 DIAGNOSIS — I129 Hypertensive chronic kidney disease with stage 1 through stage 4 chronic kidney disease, or unspecified chronic kidney disease: Secondary | ICD-10-CM | POA: Diagnosis not present

## 2023-07-29 DIAGNOSIS — N1831 Chronic kidney disease, stage 3a: Secondary | ICD-10-CM | POA: Diagnosis not present

## 2023-07-29 DIAGNOSIS — E1142 Type 2 diabetes mellitus with diabetic polyneuropathy: Secondary | ICD-10-CM | POA: Diagnosis not present

## 2023-07-29 DIAGNOSIS — J309 Allergic rhinitis, unspecified: Secondary | ICD-10-CM | POA: Diagnosis not present

## 2023-07-29 DIAGNOSIS — E782 Mixed hyperlipidemia: Secondary | ICD-10-CM | POA: Diagnosis not present

## 2023-07-29 DIAGNOSIS — E1143 Type 2 diabetes mellitus with diabetic autonomic (poly)neuropathy: Secondary | ICD-10-CM | POA: Diagnosis not present

## 2023-07-29 DIAGNOSIS — M8580 Other specified disorders of bone density and structure, unspecified site: Secondary | ICD-10-CM | POA: Diagnosis not present

## 2023-07-29 DIAGNOSIS — R42 Dizziness and giddiness: Secondary | ICD-10-CM | POA: Diagnosis not present

## 2023-07-29 DIAGNOSIS — G720 Drug-induced myopathy: Secondary | ICD-10-CM | POA: Diagnosis not present

## 2023-08-09 DIAGNOSIS — M79671 Pain in right foot: Secondary | ICD-10-CM | POA: Diagnosis not present

## 2023-08-09 DIAGNOSIS — E1140 Type 2 diabetes mellitus with diabetic neuropathy, unspecified: Secondary | ICD-10-CM | POA: Diagnosis not present

## 2023-08-09 DIAGNOSIS — E1142 Type 2 diabetes mellitus with diabetic polyneuropathy: Secondary | ICD-10-CM | POA: Diagnosis not present

## 2023-08-09 DIAGNOSIS — I10 Essential (primary) hypertension: Secondary | ICD-10-CM | POA: Diagnosis not present

## 2023-08-22 ENCOUNTER — Telehealth: Payer: Self-pay

## 2023-08-22 NOTE — Telephone Encounter (Signed)
 Up to date on meds, next review in July

## 2023-09-14 ENCOUNTER — Telehealth: Payer: Self-pay

## 2023-09-14 NOTE — Telephone Encounter (Signed)
 Overdue on valsartan/hydrochlorothiazide. Outreach unsuccessful, will try again next week

## 2023-09-19 ENCOUNTER — Telehealth: Payer: Self-pay

## 2023-09-19 NOTE — Telephone Encounter (Signed)
 Up to date on meds, next review in September

## 2023-11-23 DIAGNOSIS — M8580 Other specified disorders of bone density and structure, unspecified site: Secondary | ICD-10-CM | POA: Diagnosis not present

## 2023-11-23 DIAGNOSIS — E1143 Type 2 diabetes mellitus with diabetic autonomic (poly)neuropathy: Secondary | ICD-10-CM | POA: Diagnosis not present

## 2023-11-29 DIAGNOSIS — E1143 Type 2 diabetes mellitus with diabetic autonomic (poly)neuropathy: Secondary | ICD-10-CM | POA: Diagnosis not present

## 2023-11-29 DIAGNOSIS — E782 Mixed hyperlipidemia: Secondary | ICD-10-CM | POA: Diagnosis not present

## 2023-11-29 DIAGNOSIS — G720 Drug-induced myopathy: Secondary | ICD-10-CM | POA: Diagnosis not present

## 2023-11-29 DIAGNOSIS — N1831 Chronic kidney disease, stage 3a: Secondary | ICD-10-CM | POA: Diagnosis not present

## 2023-11-29 DIAGNOSIS — I10 Essential (primary) hypertension: Secondary | ICD-10-CM | POA: Diagnosis not present

## 2023-11-29 DIAGNOSIS — I129 Hypertensive chronic kidney disease with stage 1 through stage 4 chronic kidney disease, or unspecified chronic kidney disease: Secondary | ICD-10-CM | POA: Diagnosis not present

## 2023-11-29 DIAGNOSIS — M8580 Other specified disorders of bone density and structure, unspecified site: Secondary | ICD-10-CM | POA: Diagnosis not present

## 2023-11-29 DIAGNOSIS — Z0001 Encounter for general adult medical examination with abnormal findings: Secondary | ICD-10-CM | POA: Diagnosis not present

## 2023-11-29 DIAGNOSIS — M25511 Pain in right shoulder: Secondary | ICD-10-CM | POA: Diagnosis not present

## 2023-11-29 DIAGNOSIS — M5441 Lumbago with sciatica, right side: Secondary | ICD-10-CM | POA: Diagnosis not present

## 2023-11-29 DIAGNOSIS — J309 Allergic rhinitis, unspecified: Secondary | ICD-10-CM | POA: Diagnosis not present

## 2023-11-29 DIAGNOSIS — E1140 Type 2 diabetes mellitus with diabetic neuropathy, unspecified: Secondary | ICD-10-CM | POA: Diagnosis not present

## 2024-01-06 ENCOUNTER — Encounter (HOSPITAL_COMMUNITY)
Admission: EM | Disposition: A | Discharge status: Home / Self Care | Payer: Self-pay | Source: Home / Self Care | Attending: Cardiovascular Disease

## 2024-01-06 ENCOUNTER — Inpatient Hospital Stay (HOSPITAL_COMMUNITY)

## 2024-01-06 ENCOUNTER — Encounter (HOSPITAL_COMMUNITY): Payer: Self-pay

## 2024-01-06 ENCOUNTER — Inpatient Hospital Stay (HOSPITAL_COMMUNITY)
Admission: EM | Admit: 2024-01-06 | Discharge: 2024-01-08 | DRG: 321 | Disposition: A | Discharge status: Home / Self Care | Attending: Cardiovascular Disease | Admitting: Cardiovascular Disease

## 2024-01-06 ENCOUNTER — Emergency Department (HOSPITAL_COMMUNITY)

## 2024-01-06 ENCOUNTER — Telehealth (HOSPITAL_COMMUNITY): Payer: Self-pay

## 2024-01-06 ENCOUNTER — Other Ambulatory Visit: Payer: Self-pay

## 2024-01-06 ENCOUNTER — Other Ambulatory Visit (HOSPITAL_COMMUNITY): Payer: Self-pay

## 2024-01-06 DIAGNOSIS — E785 Hyperlipidemia, unspecified: Secondary | ICD-10-CM | POA: Diagnosis present

## 2024-01-06 DIAGNOSIS — I5021 Acute systolic (congestive) heart failure: Secondary | ICD-10-CM | POA: Diagnosis present

## 2024-01-06 DIAGNOSIS — Z955 Presence of coronary angioplasty implant and graft: Secondary | ICD-10-CM

## 2024-01-06 DIAGNOSIS — E669 Obesity, unspecified: Secondary | ICD-10-CM | POA: Diagnosis present

## 2024-01-06 DIAGNOSIS — I2102 ST elevation (STEMI) myocardial infarction involving left anterior descending coronary artery: Principal | ICD-10-CM | POA: Diagnosis present

## 2024-01-06 DIAGNOSIS — E872 Acidosis, unspecified: Secondary | ICD-10-CM | POA: Diagnosis present

## 2024-01-06 DIAGNOSIS — E876 Hypokalemia: Secondary | ICD-10-CM | POA: Diagnosis present

## 2024-01-06 DIAGNOSIS — I110 Hypertensive heart disease with heart failure: Secondary | ICD-10-CM | POA: Diagnosis present

## 2024-01-06 DIAGNOSIS — Z79899 Other long term (current) drug therapy: Secondary | ICD-10-CM

## 2024-01-06 DIAGNOSIS — E119 Type 2 diabetes mellitus without complications: Secondary | ICD-10-CM | POA: Diagnosis present

## 2024-01-06 DIAGNOSIS — I2510 Atherosclerotic heart disease of native coronary artery without angina pectoris: Secondary | ICD-10-CM | POA: Diagnosis present

## 2024-01-06 DIAGNOSIS — Z888 Allergy status to other drugs, medicaments and biological substances status: Secondary | ICD-10-CM

## 2024-01-06 DIAGNOSIS — I2109 ST elevation (STEMI) myocardial infarction involving other coronary artery of anterior wall: Secondary | ICD-10-CM | POA: Diagnosis not present

## 2024-01-06 DIAGNOSIS — Z833 Family history of diabetes mellitus: Secondary | ICD-10-CM

## 2024-01-06 DIAGNOSIS — Z7902 Long term (current) use of antithrombotics/antiplatelets: Secondary | ICD-10-CM | POA: Diagnosis not present

## 2024-01-06 DIAGNOSIS — Z7984 Long term (current) use of oral hypoglycemic drugs: Secondary | ICD-10-CM | POA: Diagnosis not present

## 2024-01-06 DIAGNOSIS — Z7982 Long term (current) use of aspirin: Secondary | ICD-10-CM | POA: Diagnosis not present

## 2024-01-06 HISTORY — PX: CORONARY/GRAFT ACUTE MI REVASCULARIZATION: CATH118305

## 2024-01-06 HISTORY — PX: LEFT HEART CATH AND CORONARY ANGIOGRAPHY: CATH118249

## 2024-01-06 LAB — ECHOCARDIOGRAM COMPLETE
AR max vel: 1.85 cm2
AV Area VTI: 1.83 cm2
AV Area mean vel: 1.62 cm2
AV Mean grad: 3 mmHg
AV Peak grad: 5.6 mmHg
Ao pk vel: 1.18 m/s
Area-P 1/2: 4.57 cm2
Calc EF: 37.2 %
Height: 60 in
MV VTI: 2.36 cm2
S' Lateral: 2.4 cm
Single Plane A2C EF: 28.5 %
Single Plane A4C EF: 40.4 %
Weight: 2144 [oz_av]

## 2024-01-06 LAB — CBC
HCT: 43.5 % (ref 36.0–46.0)
Hemoglobin: 14.3 g/dL (ref 12.0–15.0)
MCH: 28.9 pg (ref 26.0–34.0)
MCHC: 32.9 g/dL (ref 30.0–36.0)
MCV: 87.9 fL (ref 80.0–100.0)
Platelets: 378 K/uL (ref 150–400)
RBC: 4.95 MIL/uL (ref 3.87–5.11)
RDW: 13.7 % (ref 11.5–15.5)
WBC: 11.2 K/uL — ABNORMAL HIGH (ref 4.0–10.5)
nRBC: 0 % (ref 0.0–0.2)

## 2024-01-06 LAB — GLUCOSE, CAPILLARY
Glucose-Capillary: 117 mg/dL — ABNORMAL HIGH (ref 70–99)
Glucose-Capillary: 154 mg/dL — ABNORMAL HIGH (ref 70–99)
Glucose-Capillary: 135 mg/dL — ABNORMAL HIGH (ref 70–99)
Glucose-Capillary: 163 mg/dL — ABNORMAL HIGH (ref 70–99)

## 2024-01-06 LAB — CG4 I-STAT (LACTIC ACID)
Lactic Acid, Venous: 3.5 mmol/L (ref 0.5–1.9)
Lactic Acid, Venous: 2.9 mmol/L (ref 0.5–1.9)
Lactic Acid, Venous: 3 mmol/L (ref 0.5–1.9)

## 2024-01-06 LAB — MRSA NEXT GEN BY PCR, NASAL: MRSA by PCR Next Gen: NOT DETECTED

## 2024-01-06 LAB — POCT I-STAT, CHEM 8
BUN: 17 mg/dL (ref 8–23)
Calcium, Ion: 1.08 mmol/L — ABNORMAL LOW (ref 1.15–1.40)
Chloride: 102 mmol/L (ref 98–111)
Creatinine, Ser: 0.9 mg/dL (ref 0.44–1.00)
Glucose, Bld: 181 mg/dL — ABNORMAL HIGH (ref 70–99)
HCT: 35 % — ABNORMAL LOW (ref 36.0–46.0)
Hemoglobin: 11.9 g/dL — ABNORMAL LOW (ref 12.0–15.0)
Potassium: 2.9 mmol/L — ABNORMAL LOW (ref 3.5–5.1)
Sodium: 139 mmol/L (ref 135–145)
TCO2: 20 mmol/L — ABNORMAL LOW (ref 22–32)

## 2024-01-06 LAB — LIPID PANEL
Cholesterol: 179 mg/dL (ref 0–200)
HDL: 37 mg/dL — ABNORMAL LOW (ref >40)
LDL Cholesterol: 102 mg/dL — ABNORMAL HIGH (ref 0–99)
Total CHOL/HDL Ratio: 4.8 ratio
Triglycerides: 201 mg/dL — ABNORMAL HIGH (ref <150)
VLDL: 40 mg/dL (ref 0–40)

## 2024-01-06 LAB — PROTIME-INR
INR: 1 (ref 0.8–1.2)
Prothrombin Time: 14.2 s (ref 11.4–15.2)

## 2024-01-06 LAB — BASIC METABOLIC PANEL WITH GFR
Anion gap: 13 (ref 5–15)
BUN: 15 mg/dL (ref 8–23)
CO2: 21 mmol/L — ABNORMAL LOW (ref 22–32)
Calcium: 9.2 mg/dL (ref 8.9–10.3)
Chloride: 105 mmol/L (ref 98–111)
Creatinine, Ser: 1.31 mg/dL — ABNORMAL HIGH (ref 0.44–1.00)
GFR, Estimated: 43 mL/min — ABNORMAL LOW (ref >60)
Glucose, Bld: 97 mg/dL (ref 70–99)
Potassium: 3.5 mmol/L (ref 3.5–5.1)
Sodium: 139 mmol/L (ref 135–145)

## 2024-01-06 LAB — POCT ACTIVATED CLOTTING TIME
Activated Clotting Time: 308 s
Activated Clotting Time: 296 s
Activated Clotting Time: 297 s
Activated Clotting Time: 0 s
Activated Clotting Time: 124 s

## 2024-01-06 LAB — TROPONIN I (HIGH SENSITIVITY)
Troponin I (High Sensitivity): 1031 ng/L (ref <18)
Troponin I (High Sensitivity): 11470 ng/L (ref <18)
Troponin I (High Sensitivity): 19849 ng/L (ref <18)

## 2024-01-06 LAB — HEPATIC FUNCTION PANEL
ALT: 21 U/L (ref 0–44)
AST: 71 U/L — ABNORMAL HIGH (ref 15–41)
Albumin: 3.3 g/dL — ABNORMAL LOW (ref 3.5–5.0)
Alkaline Phosphatase: 64 U/L (ref 38–126)
Bilirubin, Direct: 0.1 mg/dL (ref 0.0–0.2)
Indirect Bilirubin: 0.9 mg/dL (ref 0.3–0.9)
Total Bilirubin: 1 mg/dL (ref 0.0–1.2)
Total Protein: 6.3 g/dL — ABNORMAL LOW (ref 6.5–8.1)

## 2024-01-06 LAB — HEMOGLOBIN A1C
Hgb A1c MFr Bld: 6.7 % — ABNORMAL HIGH (ref 4.8–5.6)
Mean Plasma Glucose: 145.59 mg/dL

## 2024-01-06 LAB — APTT
aPTT: 184 s (ref 24–36)
aPTT: 24 s (ref 24–36)

## 2024-01-06 LAB — MAGNESIUM: Magnesium: 1.6 mg/dL — ABNORMAL LOW (ref 1.7–2.4)

## 2024-01-06 LAB — TSH: TSH: 2.089 u[IU]/mL (ref 0.350–4.500)

## 2024-01-06 LAB — HIV ANTIBODY (ROUTINE TESTING W REFLEX): HIV Screen 4th Generation wRfx: NONREACTIVE

## 2024-01-06 SURGERY — LEFT HEART CATH AND CORONARY ANGIOGRAPHY
Anesthesia: LOCAL

## 2024-01-06 MED ORDER — PERFLUTREN LIPID MICROSPHERE
1.0000 mL | INTRAVENOUS | Status: AC | PRN
  Administered 2024-01-06: 3 mL via INTRAVENOUS

## 2024-01-06 MED ORDER — FENTANYL CITRATE (PF) 100 MCG/2ML IJ SOLN
INTRAMUSCULAR | Status: AC
  Filled 2024-01-06: qty 2

## 2024-01-06 MED ORDER — TICAGRELOR 90 MG PO TABS
ORAL_TABLET | ORAL | Status: AC
  Filled 2024-01-06: qty 2

## 2024-01-06 MED ORDER — NOREPINEPHRINE 4 MG/250ML-% IV SOLN: 0.0000 ug/min | INTRAVENOUS | Status: DC

## 2024-01-06 MED ORDER — HEPARIN (PORCINE) IN NACL 1000-0.9 UT/500ML-% IV SOLN
INTRAVENOUS | Status: DC | PRN
  Administered 2024-01-06: 1000 mL via INTRA_ARTERIAL

## 2024-01-06 MED ORDER — NOREPINEPHRINE 4 MG/250ML-% IV SOLN
INTRAVENOUS | Status: AC
  Filled 2024-01-06: qty 250

## 2024-01-06 MED ORDER — NITROGLYCERIN 1 MG/10 ML FOR IR/CATH LAB
INTRA_ARTERIAL | Status: AC
  Filled 2024-01-06: qty 10

## 2024-01-06 MED ORDER — MAGNESIUM SULFATE 4 GM/100ML IV SOLN
4.0000 g | Freq: Once | INTRAVENOUS | Status: AC
  Administered 2024-01-06: 4 g via INTRAVENOUS
  Filled 2024-01-06: qty 100

## 2024-01-06 MED ORDER — SODIUM CHLORIDE 0.9% FLUSH
3.0000 mL | Freq: Two times a day (BID) | INTRAVENOUS | Status: DC
  Administered 2024-01-06 – 2024-01-08 (×5): 3 mL via INTRAVENOUS

## 2024-01-06 MED ORDER — ONDANSETRON HCL 4 MG/2ML IJ SOLN
4.0000 mg | Freq: Four times a day (QID) | INTRAMUSCULAR | Status: DC | PRN
  Administered 2024-01-06: 4 mg via INTRAVENOUS
  Filled 2024-01-06: qty 2

## 2024-01-06 MED ORDER — NITROGLYCERIN 1 MG/10 ML FOR IR/CATH LAB
INTRA_ARTERIAL | Status: DC | PRN
  Administered 2024-01-06: 200 ug via INTRACORONARY

## 2024-01-06 MED ORDER — SODIUM CHLORIDE 0.9 % IV SOLN: 250.0000 mL | INTRAVENOUS | Status: AC | PRN

## 2024-01-06 MED ORDER — HYDRALAZINE HCL 20 MG/ML IJ SOLN: 10.0000 mg | INTRAMUSCULAR | Status: AC | PRN

## 2024-01-06 MED ORDER — TIROFIBAN HCL IN NACL 5-0.9 MG/100ML-% IV SOLN: 0.1500 ug/kg/min | INTRAVENOUS | Status: AC

## 2024-01-06 MED ORDER — TIROFIBAN (AGGRASTAT) BOLUS VIA INFUSION
INTRAVENOUS | Status: DC | PRN
  Administered 2024-01-06: 1520 ug via INTRAVENOUS

## 2024-01-06 MED ORDER — INSULIN ASPART 100 UNIT/ML IJ SOLN
0.0000 [IU] | Freq: Three times a day (TID) | INTRAMUSCULAR | Status: DC
  Administered 2024-01-06 – 2024-01-08 (×6): 3 [IU] via SUBCUTANEOUS
  Filled 2024-01-06 (×4): qty 3

## 2024-01-06 MED ORDER — TICAGRELOR 90 MG PO TABS
ORAL_TABLET | ORAL | Status: DC | PRN
  Administered 2024-01-06: 180 mg via ORAL

## 2024-01-06 MED ORDER — HEPARIN SODIUM (PORCINE) 1000 UNIT/ML IJ SOLN
INTRAMUSCULAR | Status: DC | PRN
  Administered 2024-01-06: 5000 [IU] via INTRAVENOUS

## 2024-01-06 MED ORDER — ACETAMINOPHEN 325 MG PO TABS
650.0000 mg | ORAL_TABLET | ORAL | Status: DC | PRN
  Administered 2024-01-06: 650 mg via ORAL
  Filled 2024-01-06: qty 2

## 2024-01-06 MED ORDER — FENTANYL CITRATE (PF) 100 MCG/2ML IJ SOLN
INTRAMUSCULAR | Status: DC | PRN
  Administered 2024-01-06: 25 ug via INTRAVENOUS

## 2024-01-06 MED ORDER — ASPIRIN 81 MG PO CHEW
324.0000 mg | CHEWABLE_TABLET | Freq: Once | ORAL | Status: AC
  Administered 2024-01-06: 324 mg via ORAL

## 2024-01-06 MED ORDER — LIDOCAINE HCL (PF) 1 % IJ SOLN
INTRAMUSCULAR | Status: AC
  Filled 2024-01-06: qty 30

## 2024-01-06 MED ORDER — TIROFIBAN HCL IN NACL 5-0.9 MG/100ML-% IV SOLN
INTRAVENOUS | Status: AC
  Filled 2024-01-06: qty 100

## 2024-01-06 MED ORDER — FREE WATER: 500.0000 mL | Freq: Once | Status: DC

## 2024-01-06 MED ORDER — VERAPAMIL HCL 2.5 MG/ML IV SOLN
INTRAVENOUS | Status: DC | PRN
  Administered 2024-01-06: 10 mL via INTRA_ARTERIAL

## 2024-01-06 MED ORDER — VERAPAMIL HCL 2.5 MG/ML IV SOLN
INTRAVENOUS | Status: AC
  Filled 2024-01-06: qty 2

## 2024-01-06 MED ORDER — ASPIRIN 81 MG PO CHEW
81.0000 mg | CHEWABLE_TABLET | Freq: Every day | ORAL | Status: DC
  Administered 2024-01-07 – 2024-01-08 (×2): 81 mg via ORAL
  Filled 2024-01-06 (×2): qty 1

## 2024-01-06 MED ORDER — PROMETHAZINE (PHENERGAN) 6.25MG IN NS 50ML IVPB
6.2500 mg | Freq: Four times a day (QID) | INTRAVENOUS | Status: DC | PRN
  Administered 2024-01-06: 6.25 mg via INTRAVENOUS
  Filled 2024-01-06: qty 6.25

## 2024-01-06 MED ORDER — LABETALOL HCL 5 MG/ML IV SOLN: 10.0000 mg | INTRAVENOUS | Status: AC | PRN

## 2024-01-06 MED ORDER — TICAGRELOR 90 MG PO TABS
90.0000 mg | ORAL_TABLET | Freq: Two times a day (BID) | ORAL | Status: DC
  Administered 2024-01-06 – 2024-01-08 (×4): 90 mg via ORAL
  Filled 2024-01-06 (×4): qty 1

## 2024-01-06 MED ORDER — SODIUM CHLORIDE 0.9 % IV SOLN: INTRAVENOUS | Status: AC

## 2024-01-06 MED ORDER — LORAZEPAM 2 MG/ML IJ SOLN: 1.0000 mg | Freq: Once | INTRAMUSCULAR | Status: AC

## 2024-01-06 MED ORDER — POTASSIUM CHLORIDE CRYS ER 20 MEQ PO TBCR
40.0000 meq | EXTENDED_RELEASE_TABLET | Freq: Once | ORAL | Status: AC
  Administered 2024-01-06: 40 meq via ORAL
  Filled 2024-01-06: qty 2

## 2024-01-06 MED ORDER — LACTATED RINGERS IV BOLUS
1000.0000 mL | Freq: Once | INTRAVENOUS | Status: AC
  Administered 2024-01-06: 1000 mL via INTRAVENOUS

## 2024-01-06 MED ORDER — HEPARIN SODIUM (PORCINE) 5000 UNIT/ML IJ SOLN
60.0000 [IU]/kg | Freq: Once | INTRAMUSCULAR | Status: AC
  Administered 2024-01-06: 3650 [IU] via INTRAVENOUS

## 2024-01-06 MED ORDER — MIDAZOLAM HCL (PF) 2 MG/2ML IJ SOLN
INTRAMUSCULAR | Status: DC | PRN
  Administered 2024-01-06: 1 mg via INTRAVENOUS

## 2024-01-06 MED ORDER — SODIUM CHLORIDE 0.9% FLUSH: 3.0000 mL | INTRAVENOUS | Status: DC | PRN

## 2024-01-06 MED ORDER — TIROFIBAN HCL IN NACL 5-0.9 MG/100ML-% IV SOLN
INTRAVENOUS | Status: DC | PRN
  Administered 2024-01-06: .15 ug/kg/min via INTRAVENOUS

## 2024-01-06 MED ORDER — LIDOCAINE HCL (PF) 1 % IJ SOLN
INTRAMUSCULAR | Status: DC | PRN
  Administered 2024-01-06: 10 mL
  Administered 2024-01-06: 2 mL

## 2024-01-06 MED ORDER — LORAZEPAM 2 MG/ML IJ SOLN
INTRAMUSCULAR | Status: AC
  Administered 2024-01-06: 1 mg via INTRAVENOUS
  Filled 2024-01-06: qty 1

## 2024-01-06 MED ORDER — HEPARIN SODIUM (PORCINE) 1000 UNIT/ML IJ SOLN
INTRAMUSCULAR | Status: AC
  Filled 2024-01-06: qty 10

## 2024-01-06 MED ORDER — MIDAZOLAM HCL 2 MG/2ML IJ SOLN
INTRAMUSCULAR | Status: AC
  Filled 2024-01-06: qty 2

## 2024-01-06 SURGICAL SUPPLY — 19 items
BALLOON EMERGE MR 2.0X12 (BALLOONS) IMPLANT
BALLOON ~~LOC~~ EMERGE MR 3.25X12 (BALLOONS) IMPLANT
CATH 5FR JL3.5 JR4 ANG PIG MP (CATHETERS) IMPLANT
CATH INFINITI 5FR JL4 (CATHETERS) IMPLANT
CATH SWAN GANZ 7F STRAIGHT (CATHETERS) IMPLANT
CATH VISTA GUIDE 6FR XBLD 3.5 (CATHETERS) IMPLANT
DEVICE RAD TR BAND REGULAR (VASCULAR PRODUCTS) IMPLANT
ELECT DEFIB PAD ADLT CADENCE (PAD) IMPLANT
GLIDESHEATH SLEND SS 6F .021 (SHEATH) IMPLANT
GUIDEWIRE INQWIRE 1.5J.035X260 (WIRE) IMPLANT
GUIDEWIRE TIGER .035X300 (WIRE) IMPLANT
KIT ENCORE 26 ADVANTAGE (KITS) IMPLANT
KIT MICROPUNCTURE NIT STIFF (SHEATH) IMPLANT
PACK CARDIAC CATHETERIZATION (CUSTOM PROCEDURE TRAY) ×2 IMPLANT
SHEATH PINNACLE 6F 10CM (SHEATH) IMPLANT
SHEATH PINNACLE 7F 10CM (SHEATH) IMPLANT
SHEATH PROBE COVER 6X72 (BAG) IMPLANT
STENT SYNERGY XD 3.0X16 (Permanent Stent) IMPLANT
WIRE ASAHI PROWATER 180CM (WIRE) IMPLANT

## 2024-01-06 NOTE — ED Triage Notes (Signed)
 Pt c/o midsternal chest pain radiating into left jaw for past two weeks. Pt states pain has been worsening. Pt has had shortness of breath, denies nausea or vomiting. Pt states she took tylenol which seemed to help. Pt states pain worsens with movement.

## 2024-01-06 NOTE — Telephone Encounter (Signed)
 Pharmacy Patient Advocate Encounter  Insurance verification completed.    The patient is insured through [facility]. Patient has Medicare and is not eligible for a copay card, but may be able to apply for patient assistance or Medicare RX Payment Plan (Patient Must reach out to their plan, if eligible for payment plan), if available.    Ran test claim for Farxiga 10 mg tablets and the current 30 day co-pay is $387.  Ran test claim for Jardiance 10 mg tablets and the current 30 day co-pay is $387.  Ran test claim for Brilinta 90mg  tablets and the current 30 day co-pay is $387.   This test claim was processed through [facility]- copay amounts may vary at other pharmacies due to pharmacy/plan contracts, or as the patient moves through the different stages of their insurance plan.

## 2024-01-06 NOTE — Progress Notes (Signed)
 Advanced Heart Failure Rounding Note  Cardiologist: None  Chief Complaint: STEMI Subjective:    Presented with on and off CP for 2 weeks.   LHC today with prox LAD 99% stenosed. DES successfully placed.   Resting comfortably in bed, feels much better. Son and daughter at bedside. Denies CP.  Objective:   Weight Range: 60.8 kg Body mass index is 26.17 kg/m.   Vital Signs:   Temp:  [98.3 F (36.8 C)] 98.3 F (36.8 C) (11/07 1025) Pulse Rate:  [87-101] 89 (11/07 1155) Resp:  [18-31] 19 (11/07 1155) BP: (115-156)/(63-80) 116/65 (11/07 1155) SpO2:  [93 %-100 %] 98 % (11/07 1155) Weight:  [60.8 kg] 60.8 kg (11/07 1025)    Weight change: Filed Weights   01/06/24 1025  Weight: 60.8 kg    Intake/Output:  No intake or output data in the 24 hours ending 01/06/24 1239    Physical Exam  General:  well appearing.  No respiratory difficulty Neck: JVD flat.  Cor: Regular rate & rhythm. No murmurs. Lungs: clear Extremities: no edema  Neuro: alert & oriented x 3. Affect pleasant.  Telemetry   NSR 80s-90s (Personally reviewed)    EKG    Findings consistent with anterior STEMI  Labs    CBC Recent Labs    01/06/24 1034 01/06/24 1145  WBC 11.2*  --   HGB 14.3 11.9*  HCT 43.5 35.0*  MCV 87.9  --   PLT 378  --    Basic Metabolic Panel Recent Labs    88/92/74 1145  NA 139  K 2.9*  CL 102  GLUCOSE 181*  BUN 17  CREATININE 0.90   Liver Function Tests No results for input(s): AST, ALT, ALKPHOS, BILITOT, PROT, ALBUMIN in the last 72 hours. No results for input(s): LIPASE, AMYLASE in the last 72 hours. Cardiac Enzymes No results for input(s): CKTOTAL, CKMB, CKMBINDEX, TROPONINI in the last 72 hours.  BNP: BNP (last 3 results) No results for input(s): BNP in the last 8760 hours.  ProBNP (last 3 results) No results for input(s): PROBNP in the last 8760 hours.   D-Dimer No results for input(s): DDIMER in the last 72  hours. Hemoglobin A1C Recent Labs    01/06/24 1034  HGBA1C 6.7*   Fasting Lipid Panel No results for input(s): CHOL, HDL, LDLCALC, TRIG, CHOLHDL, LDLDIRECT in the last 72 hours. Thyroid Function Tests No results for input(s): TSH, T4TOTAL, T3FREE, THYROIDAB in the last 72 hours.  Invalid input(s): FREET3  Other results:   Imaging    CARDIAC CATHETERIZATION Result Date: 01/06/2024   Prox LAD lesion is 99% stenosed.   A drug-eluting stent was successfully placed using a STENT SYNERGY XD 3.0X16.   Post intervention, there is a 0% residual stenosis. 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 Stable BP, elevated lactic acid with trend down after case Recommendations: Will admit to the ICU. She is hemodynamically stable despite having an elevated lactic acid and LV systolic dysfunction. Case discussed with Shock team. Will not perform right heart cath given her hemodynamic stability. DAPT with ASA and Brilinta for one year. Aggrastat infusion two hours post PCI. Will not start a statin given her history of statin intolerance.     Medications:     Scheduled Medications:  [START ON 01/07/2024] aspirin  81 mg Oral Daily  free water  500 mL Oral Once   insulin aspart  0-15 Units Subcutaneous TID WC   nitroGLYCERIN       sodium chloride flush  3 mL Intravenous Q12H   ticagrelor  90 mg Oral BID    Infusions:  sodium chloride 20 mL/hr at 01/06/24 1041   sodium chloride     tirofiban      PRN Medications: sodium chloride, acetaminophen, hydrALAZINE, labetalol, nitroGLYCERIN, ondansetron (ZOFRAN) IV, sodium chloride flush    Patient Profile   Laurie Lamb is a 73 y.o. female with HTN, DB and HLD. Admitted with anterior STEMI.   Assessment/Plan   STEMI - LHC today with prox LAD 99% stenosed. DES successfully placed. LVEF 20-25% - Denies CP - Check lipid panel. Statin intolerant, will need referral to lipid clinic at discharge. On Lovaza at home. - Continue DAPT for 1 year (ASA +DAPT) - Aggrastat for 2 hrs.  - Echo today  Acute systolic heart failure - LVEF on cath 20-25% - Formal echo today - GDMT as tolerated. Start spiro 25 mg daily.  - Will need repeat echo in ~2-3 months - Check TSH/HIV  Hypokalemia - K 2.8 on iStat - Sending down stat BMET  Hypertension - BP elevated - On Diovan at home. Plan for Losartan>Entresto  DB - Will order SSI   Length of Stay: 0  [name], NP  01/06/2024, 12:39 PM  Advanced Heart Failure Team Pager [phone] (M-F; 7a - 5p)  Please contact [facility] for night-coverage after hours (5p -7a ) and weekends on [email or web address]

## 2024-01-06 NOTE — ED Provider Notes (Signed)
 [place] EMERGENCY DEPARTMENT AT [facility] Provider Note   CSN: [id number] Arrival date & time: 01/06/24  1017     Patient presents with: Chest Pain   Laurie Lamb is a 73 y.o. female.   This is a 73 year old female presenting emergency department with chest pain.  Intermittent symptoms x 2 weeks.  Constant starting last night.  Pain radiating to her jaw.  Described as tightness.  No cardiac history.  Symptoms worsen with movement and ambulation.   Chest Pain      Prior to Admission medications   Medication Sig Start Date End Date Taking? Authorizing Provider  acetaminophen (TYLENOL) 500 MG tablet Take 500 mg by mouth daily as needed for mild pain (pain score 1-3).   Yes [provider]  aspirin EC 81 MG tablet Take 81 mg by mouth daily. Swallow whole.   Yes [provider]  calcium carbonate (OSCAL) 1500 (600 Ca) MG TABS tablet Take 600 mg of elemental calcium by mouth 2 (two) times daily with a meal.   Yes [provider]  glimepiride (AMARYL) 1 MG tablet Take 1 mg by mouth daily with breakfast.   Yes [provider]  metFORMIN (GLUCOPHAGE) 500 MG tablet Take by mouth 2 (two) times daily with a meal.   Yes [provider]  valsartan-hydrochlorothiazide (DIOVAN-HCT) 160-12.5 MG tablet Take 1 tablet by mouth daily.   Yes [provider]  amLODipine (NORVASC) 5 MG tablet Take 5 mg by mouth daily. Patient not taking: Reported on 01/06/2024    [provider]  omega-3 acid ethyl esters (LOVAZA) 1 g capsule Take 1 g by mouth 2 (two) times daily. Patient not taking: Reported on 01/06/2024    [provider]  pioglitazone (ACTOS) 15 MG tablet  08/04/17   [provider]    Allergies: Amlodipine; Antihistamines, loratadine-type; Ezetimibe; Glipizide; Januvia [sitagliptin]; Levocetirizine; Lovastatin; Pioglitazone; Pravastatin; and Rosuvastatin    Review of Systems  Cardiovascular:  Positive  for chest pain.    Updated Vital Signs BP (!) 115/59   Pulse 86   Temp 98.1 F (36.7 C) (Oral)   Resp (!) 22   Ht 5' (1.524 m)   Wt 60.8 kg   SpO2 96%   BMI 26.17 kg/m   Physical Exam Vitals and nursing note reviewed.  Constitutional:      Comments: Uncomfortable appearing.   HENT:     Head: Normocephalic.  Cardiovascular:     Rate and Rhythm: Normal rate and regular rhythm.     Pulses:          Radial pulses are 2+ on the right side and 2+ on the left side.     Heart sounds: Normal heart sounds.  Pulmonary:     Effort: Pulmonary effort is normal.     Breath sounds: Normal breath sounds.  Chest:     Chest wall: No tenderness.  Musculoskeletal:     Right lower leg: No edema.     Left lower leg: No edema.  Skin:    General: Skin is warm and dry.     Capillary Refill: Capillary refill takes less than 2 seconds.  Neurological:     General: No focal deficit present.     Mental Status: She is alert.  Psychiatric:        Mood and Affect: Mood normal.        Behavior: Behavior normal.     (all labs ordered are listed, but only abnormal results  are displayed) Labs Reviewed  CBC - Abnormal; Notable for the following components:      Result Value   WBC 11.2 (*)    All other components within normal limits  HEMOGLOBIN A1C - Abnormal; Notable for the following components:   Hgb A1c MFr Bld 6.7 (*)    All other components within normal limits  APTT - Abnormal; Notable for the following components:   aPTT 184 (*)    All other components within normal limits  BASIC METABOLIC PANEL WITH GFR - Abnormal; Notable for the following components:   CO2 21 (*)    Creatinine, Ser 1.31 (*)    GFR, Estimated 43 (*)    All other components within normal limits  MAGNESIUM - Abnormal; Notable for the following components:   Magnesium 1.6 (*)    All other components within normal limits  GLUCOSE, CAPILLARY - Abnormal; Notable for the following components:   Glucose-Capillary 117  (*)    All other components within normal limits  CG4 I-STAT (LACTIC ACID) - Abnormal; Notable for the following components:   Lactic Acid, Venous 3.5 (*)    All other components within normal limits  CG4 I-STAT (LACTIC ACID) - Abnormal; Notable for the following components:   Lactic Acid, Venous 2.9 (*)    All other components within normal limits  POCT I-STAT, CHEM 8 - Abnormal; Notable for the following components:   Potassium 2.9 (*)    Glucose, Bld 181 (*)    Calcium, Ion 1.08 (*)    TCO2 20 (*)    Hemoglobin 11.9 (*)    HCT 35.0 (*)    All other components within normal limits  CG4 I-STAT (LACTIC ACID) - Abnormal; Notable for the following components:   Lactic Acid, Venous 3.0 (*)    All other components within normal limits  TROPONIN I (HIGH SENSITIVITY) - Abnormal; Notable for the following components:   Troponin I (High Sensitivity) 1,031 (*)    All other components within normal limits  TROPONIN I (HIGH SENSITIVITY) - Abnormal; Notable for the following components:   Troponin I (High Sensitivity) 11,470 (*)    All other components within normal limits  MRSA NEXT GEN BY PCR, NASAL  PROTIME-INR  TSH  HIV ANTIBODY (ROUTINE TESTING W REFLEX)  LIPID PANEL  HEPATIC FUNCTION PANEL  I-STAT CG4 LACTIC ACID, ED  POCT ACTIVATED CLOTTING TIME  POCT ACTIVATED CLOTTING TIME  POCT ACTIVATED CLOTTING TIME  I-STAT CG4 LACTIC ACID, ED  TROPONIN I (HIGH SENSITIVITY)    EKG: EKG Interpretation Date/Time:  Friday January 06 2024 10:27:20 EST Ventricular Rate:  86 PR Interval:  148 QRS Duration:  92 QT Interval:  360 QTC Calculation: 430 R Axis:   0  Text Interpretation: Normal sinus rhythm Minimal voltage criteria for LVH, may be normal variant ( Cornell product ) Marked ST abnormality, possible inferior subendocardial injury Abnormal ECG When compared with ECG of 06-Oct-2004 03:15, PREVIOUS ECG IS PRESENT Confirmed by Neysa Clap [id number]) on 01/06/2024 10:31:41  AM  Radiology: [facility] Chest Port 1 View Result Date: 01/06/2024 EXAM: 1 VIEW(S) XRAY OF THE CHEST 01/06/2024 01:45:00 PM COMPARISON: 10/06/04. i. CLINICAL HISTORY: Pain FINDINGS: LUNGS AND PLEURA: No focal pulmonary opacity. No pulmonary edema. No pleural effusion. No pneumothorax. HEART AND MEDIASTINUM: Calcified aorta. No acute abnormality of the cardiac silhouette. BONES AND SOFT TISSUES: Thoracic degenerative changes. No acute fracture. IMPRESSION: 1. No acute cardiopulmonary process identified. Electronically signed by: Waddell Calk MD 01/06/2024 02:57 PM EST RP Workstation:  [id number]   CARDIAC CATHETERIZATION Result Date: 01/06/2024   Prox LAD lesion is 99% stenosed.   A drug-eluting stent was successfully placed using a STENT SYNERGY XD [id number].   Post intervention, there is a 0% residual stenosis. 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 Stable BP, elevated lactic acid with trend down after case Recommendations: Will admit to the ICU. She is hemodynamically stable despite having an elevated lactic acid and LV systolic dysfunction. Case discussed with Shock team. Will not perform right heart cath given her hemodynamic stability. DAPT with ASA and Brilinta for one year. Aggrastat infusion two hours post PCI. Will not start a statin given her history of statin intolerance.     .Critical Care  Performed by: Neysa Caron PARAS, DO Authorized by: Neysa Caron PARAS, DO   Critical care provider statement:    Critical care time (minutes):  30   Critical care was necessary to treat or prevent imminent or life-threatening deterioration of the following conditions:  Cardiac failure   Critical care was time spent personally by me on the following activities:  Development of treatment plan with patient or  surrogate, discussions with consultants, evaluation of patient's response to treatment, examination of patient, ordering and review of laboratory studies, ordering and review of radiographic studies, ordering and performing treatments and interventions, pulse oximetry, re-evaluation of patient's condition and review of old charts   Care discussed with: admitting provider      Medications Ordered in the ED  0.9 %  sodium chloride infusion ( Intravenous Infusion Verify 01/06/24 1400)  nitroGLYCERIN 100 mcg/mL intra-arterial injection (has no administration in time range)  labetalol (NORMODYNE) injection 10 mg (has no administration in time range)  hydrALAZINE (APRESOLINE) injection 10 mg (has no administration in time range)  acetaminophen (TYLENOL) tablet 650 mg (650 mg Oral Given 01/06/24 1259)  ondansetron (ZOFRAN) injection 4 mg (4 mg Intravenous Given 01/06/24 1410)  free water 500 mL (500 mLs Oral Patient Refused/Not Given 01/06/24 1300)  sodium chloride flush (NS) 0.9 % injection 3 mL (3 mLs Intravenous Given 01/06/24 1231)  sodium chloride flush (NS) 0.9 % injection 3 mL (has no administration in time range)  0.9 %  sodium chloride infusion (has no administration in time range)  aspirin chewable tablet 81 mg (has no administration in time range)  ticagrelor (BRILINTA) tablet 90 mg (has no administration in time range)  insulin aspart (novoLOG) injection 0-15 Units ( Subcutaneous Not Given 01/06/24 1313)  tirofiban (AGGRASTAT) infusion 50 mcg/mL 100 mL (0 mcg/kg/min  60.8 kg Intravenous Stopped 01/06/24 1355)  promethazine (PHENERGAN) 6.25 mg/NS 50 mL IVPB (6.25 mg Intravenous New Bag/Given 01/06/24 1506)  perflutren lipid microspheres (DEFINITY) IV suspension (3 mLs Intravenous Given 01/06/24 1440)  potassium chloride SA (KLOR-CON M) CR tablet 40 mEq (has no administration in time range)  magnesium sulfate IVPB 4 g 100 mL (has no administration in time range)  aspirin chewable tablet 324 mg (324  mg Oral Given 01/06/24 1036)  heparin injection 3,650 Units (3,650 Units Intravenous Given 01/06/24 1047)  LORazepam (ATIVAN) injection 1 mg (1 mg Intravenous Given 01/06/24 1408)    Clinical Course as of 01/06/24 1624  Fri Jan 06, 2024  1040 Evaluated triage EKG.  Concerning ST segment changes in anterior with reciprocal changes concerning for STEMI.  Activated.  Patient brought back  to room has been having some unstable angina symptoms x 2 weeks symptoms current symptoms started last night have been constant somewhat worsening this morning.  Denies cardiac history.  Awaiting cardiology evaluation. [TY]  1046 Cardiology rapidly unprofessionally to bedside evaluated patient.  Taken to [employer]. [TY]    Clinical Course User Index [TY] Neysa Caron PARAS, DO                                 Medical Decision Making This is a 73 year old presented with chest pain.  Concern for acute MI.  Given aspirin heparin.  See ED course for further MDM   Amount and/or Complexity of Data Reviewed Independent Historian:     Details: Family notes numerous allergies External Data Reviewed:     Details: History of hypertension, diabetes, obesity.  No prior provocative cardiac testing Labs: ordered. Radiology: ordered.    Details: Patient to Cath Lab ECG/medicine tests: independent interpretation performed.    Details: Anterior ST elevation in V1 V2 with reciprocal changes in inferior leads.  Code STEMI activated Discussion of management or test interpretation with external provider(s): Cardiology; to Cath Lab.  Risk Decision regarding hospitalization. Diagnosis or treatment significantly limited by social determinants of health.      Final diagnoses:  None    ED Discharge Orders     None          Neysa Caron PARAS, DO 01/06/24 1624

## 2024-01-06 NOTE — Progress Notes (Signed)
 I was alerted at 1430 that the patient was experiencing a grand mal seizure during her echocardiogram. It was reported that the seizure lasted 1-2 minutes and upon my assessment the patient had stopped seizing. Dr. Zenaida was called to bedside and a verbal order was given for 1mg  of Ativan. Patient became nauseous after this episode and [place] was ordered as well. Overall patient stabilized with minimal support needed.

## 2024-01-06 NOTE — H&P (Signed)
 Cardiology Admission History and Physical   Patient ID: Laurie Lamb MRN: [id number]; DOB: 11/26/1950   Admission date: 01/06/2024  PCP:  Shona Norleen PEDLAR, MD   [facility] Providers Cardiologist:  None   Chief Complaint:  Chest pain   History of Present Illness: Laurie Lamb is a 73 yo female with history of DM, HTN and HLD with chest pain on/off for two weeks. She had onset of severe chest pain this am and presented to the ED. She describes central chest pressure with radiation to her jaw. EKG with anterior ST elevation with inferior depression. Ongoing chest pain in the ED. Code STEMI activated by ED staff. Pt received ASA and Heparin in the ED.    Past Medical History:  Diagnosis Date   Arthritis    Diabetes mellitus without complication (HCC)    Hypertension    Past Surgical History:  Procedure Laterality Date   APPENDECTOMY     CESAREAN SECTION       Medications Prior to Admission: Prior to Admission medications   Medication Sig Start Date End Date Taking? Authorizing Provider  amLODipine (NORVASC) 5 MG tablet Take 5 mg by mouth daily.    [provider]  calcium carbonate (OSCAL) 1500 (600 Ca) MG TABS tablet Take 600 mg of elemental calcium by mouth 2 (two) times daily with a meal.    [provider]  glipiZIDE (GLUCOTROL XL) 2.5 MG 24 hr tablet Take 2.5 mg by mouth daily with breakfast.    [provider]  metFORMIN (GLUCOPHAGE) 500 MG tablet Take by mouth 2 (two) times daily with a meal.    [provider]  omega-3 acid ethyl esters (LOVAZA) 1 g capsule Take 1 g by mouth 2 (two) times daily.    [provider]  pioglitazone (ACTOS) 15 MG tablet  08/04/17   [provider]  valsartan-hydrochlorothiazide (DIOVAN-HCT) 160-12.5 MG tablet Take 1 tablet by mouth daily.    [provider]     Allergies:    Allergies  Allergen Reactions   Amlodipine     Reported as can't take by pt.     Antihistamines, Loratadine-Type    Ezetimibe     Causes neck pain   Glimepiride    Glipizide    Januvia [Sitagliptin]    Jardiance [Empagliflozin]    Levocetirizine    Lovastatin    Pioglitazone    Pravastatin    Rosuvastatin     Social History:   Social History   Socioeconomic History   Marital status: Married    Spouse name: Not on file   Number of children: Not on file   Years of education: Not on file   Highest education level: Not on file  Occupational History   Not on file  Tobacco Use   Smoking status: Never   Smokeless tobacco: Never  Vaping Use   Vaping status: Never Used  Substance and Sexual Activity   Alcohol use: Never   Drug use: Never   Sexual activity: Not on file  Other Topics Concern   Not on file  Social History Narrative   Not on file   Social Drivers of Health   Financial Resource Strain: Not on file  Food Insecurity: Not on file  Transportation Needs: Not on file  Physical Activity: Not on file  Stress: Not on file  Social Connections: Not on file  Intimate Partner Violence: Not on file     Family History:   The patient's  family history includes Cancer in her brother; Diabetes in her sister; Yvone' disease in her sister; Thyroid disease in her sister.    ROS:  Please see the history of present illness.  All other ROS reviewed and negative.     Physical Exam/Data: Vitals:   01/06/24 1025 01/06/24 1025  BP:  (!) 156/80  Pulse:  88  Resp:  18  Temp:  98.3 F (36.8 C)  TempSrc:  Oral  SpO2:  100%  Weight: 60.8 kg   Height: 5' (1.524 m)    No intake or output data in the 24 hours ending 01/06/24 1054    01/06/2024   10:25 AM 08/23/2017    8:22 AM 07/19/2017    8:20 AM  Last 3 Weights  Weight (lbs) 134 lb 135 lb 133 lb  Weight (kg) 60.782 kg 61.236 kg 60.328 kg     Body mass index is 26.17 kg/m.  General:  Well nourished, well developed, appears uncomfortable HEENT: normal Neck: no JVD Vascular: No carotid bruits;  Distal pulses 2+ bilaterally   Cardiac:  normal S1, S2; RRR; no murmur  Lungs:  clear to auscultation bilaterally, no wheezing, rhonchi or rales  Abd: soft, nontender, no hepatomegaly  Ext: no LE edema Musculoskeletal:  No deformities, BUE and BLE strength normal and equal Skin: warm and dry  Neuro:  CNs 2-12 intact, no focal abnormalities noted Psych:  Normal affect   EKG:  The ECG that was done was personally reviewed and demonstrates sinus, LVH, anterior ST elevation, inferior ST depression  Relevant CV Studies:   Laboratory Data: High Sensitivity Troponin:  No results for input(s): TROPONINIHS in the last 720 hours.    ChemistryNo results for input(s): NA, K, CL, CO2, GLUCOSE, BUN, CREATININE, CALCIUM, MG, GFRNONAA, GFRAA, ANIONGAP in the last 168 hours.  No results for input(s): PROT, ALBUMIN, AST, ALT, ALKPHOS, BILITOT in the last 168 hours. Lipids No results for input(s): CHOL, TRIG, HDL, LABVLDL, LDLCALC, CHOLHDL in the last 168 hours. HematologyNo results for input(s): WBC, RBC, HGB, HCT, MCV, MCH, MCHC, RDW, PLT in the last 168 hours. Thyroid No results for input(s): TSH, FREET4 in the last 168 hours. BNPNo results for input(s): BNP, PROBNP in the last 168 hours.  DDimer No results for input(s): DDIMER in the last 168 hours.  Radiology/Studies:  No results found.   Assessment and Plan:  Anterior STEMI: Plan emergent cardiac cath. Further plans to follow.   Code Status: Full Code  Severity of Illness: The appropriate patient status for this patient is INPATIENT. Inpatient status is judged to be reasonable and necessary in order to provide the required intensity of service to ensure the patient's safety. The patient's presenting symptoms, physical exam findings, and initial radiographic and laboratory data in the context of their chronic comorbidities is felt to place them at high risk for  further clinical deterioration. Furthermore, it is not anticipated that the patient will be medically stable for discharge from the hospital within 2 midnights of admission.   * I certify that at the point of admission it is my clinical judgment that the patient will require inpatient hospital care spanning beyond 2 midnights from the point of admission due to high intensity of service, high risk for further deterioration and high frequency of surveillance required.*   For questions or updates, please contact [facility] Please consult [email or web address] for contact info under   Signed, Lonni Cash, MD  01/06/2024 10:54 AM

## 2024-01-07 ENCOUNTER — Telehealth (HOSPITAL_COMMUNITY): Payer: Self-pay | Admitting: Pharmacist

## 2024-01-07 DIAGNOSIS — I2102 ST elevation (STEMI) myocardial infarction involving left anterior descending coronary artery: Secondary | ICD-10-CM | POA: Diagnosis not present

## 2024-01-07 LAB — BASIC METABOLIC PANEL WITH GFR
Anion gap: 14 (ref 5–15)
BUN: 15 mg/dL (ref 8–23)
CO2: 20 mmol/L — ABNORMAL LOW (ref 22–32)
Calcium: 8.5 mg/dL — ABNORMAL LOW (ref 8.9–10.3)
Chloride: 105 mmol/L (ref 98–111)
Creatinine, Ser: 1.11 mg/dL — ABNORMAL HIGH (ref 0.44–1.00)
GFR, Estimated: 53 mL/min — ABNORMAL LOW (ref >60)
Glucose, Bld: 218 mg/dL — ABNORMAL HIGH (ref 70–99)
Potassium: 3.8 mmol/L (ref 3.5–5.1)
Sodium: 139 mmol/L (ref 135–145)

## 2024-01-07 LAB — CBC
HCT: 38.1 % (ref 36.0–46.0)
Hemoglobin: 12.6 g/dL (ref 12.0–15.0)
MCH: 28.8 pg (ref 26.0–34.0)
MCHC: 33.1 g/dL (ref 30.0–36.0)
MCV: 87.2 fL (ref 80.0–100.0)
Platelets: 364 K/uL (ref 150–400)
RBC: 4.37 MIL/uL (ref 3.87–5.11)
RDW: 13.8 % (ref 11.5–15.5)
WBC: 11.7 K/uL — ABNORMAL HIGH (ref 4.0–10.5)
nRBC: 0 % (ref 0.0–0.2)

## 2024-01-07 LAB — GLUCOSE, CAPILLARY
Glucose-Capillary: 132 mg/dL — ABNORMAL HIGH (ref 70–99)
Glucose-Capillary: 196 mg/dL — ABNORMAL HIGH (ref 70–99)
Glucose-Capillary: 155 mg/dL — ABNORMAL HIGH (ref 70–99)
Glucose-Capillary: 168 mg/dL — ABNORMAL HIGH (ref 70–99)
Glucose-Capillary: 161 mg/dL — ABNORMAL HIGH (ref 70–99)

## 2024-01-07 MED ORDER — CHLORHEXIDINE GLUCONATE CLOTH 2 % EX PADS
6.0000 | MEDICATED_PAD | Freq: Every day | CUTANEOUS | Status: DC
  Administered 2024-01-07 – 2024-01-08 (×2): 6 via TOPICAL

## 2024-01-07 MED ORDER — SPIRONOLACTONE 12.5 MG HALF TABLET
12.5000 mg | ORAL_TABLET | Freq: Every day | ORAL | Status: DC
  Administered 2024-01-07 – 2024-01-08 (×2): 12.5 mg via ORAL
  Filled 2024-01-07 (×2): qty 1

## 2024-01-07 NOTE — Progress Notes (Signed)
 Patient ID: Laurie Lamb, female   DOB: 03-06-1950, 73 y.o.   MRN: [id number]     Advanced Heart Failure Rounding Note  Cardiologist: Lonni Cash, MD  Chief Complaint: STEMI Subjective:    Presented with on and off CP for 2 weeks.   LHC 11/7 with prox LAD 99% stenosed. DES successfully placed.   She was weaned off NE this morning, SBP 90s-100s now.   Resting comfortably in bed, feels much better. Son and daughter at bedside. Denies CP.   Objective:   Weight Range: 60.8 kg Body mass index is 26.17 kg/m.   Vital Signs:   Temp:  [98.1 F (36.7 C)-98.5 F (36.9 C)] 98.3 F (36.8 C) (11/08 0846) Pulse Rate:  [65-110] 93 (11/08 1015) Resp:  [13-31] 24 (11/08 1015) BP: (71-150)/(43-127) 101/55 (11/08 1015) SpO2:  [91 %-100 %] 94 % (11/08 1015) Arterial Line BP: (106-225)/(56-112) 123/64 (11/07 1745) Last BM Date :  (PTA)  Weight change: Filed Weights   01/06/24 1025  Weight: 60.8 kg    Intake/Output:   Intake/Output Summary (Last 24 hours) at 01/07/2024 1045 Last data filed at 01/07/2024 1000 Gross per 24 hour  Intake 1097.82 ml  Output 800 ml  Net 297.82 ml      Physical Exam  General: NAD Neck: No JVD, no thyromegaly or thyroid nodule.  Lungs: Clear to auscultation bilaterally with normal respiratory effort. CV: Nondisplaced PMI.  Heart regular S1/S2, no S3/S4, no murmur.  No peripheral edema.   Abdomen: Soft, nontender, no hepatosplenomegaly, no distention.  Skin: Intact without lesions or rashes.  Neurologic: Alert and oriented x 3.  Psych: Normal affect. Extremities: No clubbing or cyanosis.  HEENT: Normal.   Telemetry   NSR 80s-90s (Personally reviewed)    Labs    CBC Recent Labs    01/06/24 1034 01/06/24 1145 01/07/24 0230  WBC 11.2*  --  11.7*  HGB 14.3 11.9* 12.6  HCT 43.5 35.0* 38.1  MCV 87.9  --  87.2  PLT 378  --  364   Basic Metabolic Panel Recent Labs    88/92/74 1315 01/07/24 0230  NA 139 139  K 3.5 3.8  CL 105  105  CO2 21* 20*  GLUCOSE 97 218*  BUN 15 15  CREATININE 1.31* 1.11*  CALCIUM 9.2 8.5*  MG 1.6*  --    Liver Function Tests Recent Labs    01/06/24 1629  AST 71*  ALT 21  ALKPHOS 64  BILITOT 1.0  PROT 6.3*  ALBUMIN 3.3*   No results for input(s): LIPASE, AMYLASE in the last 72 hours. Cardiac Enzymes No results for input(s): CKTOTAL, CKMB, CKMBINDEX, TROPONINI in the last 72 hours.  BNP: BNP (last 3 results) No results for input(s): BNP in the last 8760 hours.  ProBNP (last 3 results) No results for input(s): PROBNP in the last 8760 hours.   D-Dimer No results for input(s): DDIMER in the last 72 hours. Hemoglobin A1C Recent Labs    01/06/24 1034  HGBA1C 6.7*   Fasting Lipid Panel Recent Labs    01/06/24 1034  CHOL 179  HDL 37*  LDLCALC 102*  TRIG 201*  CHOLHDL 4.8   Thyroid Function Tests Recent Labs    01/06/24 1315  TSH 2.089    Other results:   Imaging    ECHOCARDIOGRAM COMPLETE Result Date: 01/06/2024    ECHOCARDIOGRAM REPORT   Patient Name:   Laurie Lamb Date of Exam: 01/06/2024 Medical Rec #:  [id number]  Height:       60.0 in Accession #:    [id number]       Weight:       134.0 lb Date of Birth:  [birth year]        BSA:          1.574 m Patient Age:    73 years         BP:           116/65 mmHg Patient Gender: F                HR:           86 bpm. Exam Location:  Inpatient Procedure: 2D Echo, Cardiac Doppler, Color Doppler and Intracardiac            Opacification Agent (Both Spectral and Color Flow Doppler were            utilized during procedure). Indications:    CAD I25.10  History:        Patient has no prior history of Echocardiogram examinations. CAD                 and STEMI; Risk Factors:Diabetes and Hypertension.  Sonographer:    BERNARDA ROCKS Referring Phys: 22 CHRISTOPHER D MCALHANY  Sonographer Comments: Technically challenging study due to limited acoustic windows. IMPRESSIONS  1. Technically challenging  study.  2. No left ventricular mural or apical thrombus/thrombi.  3. Left ventricular ejection fraction, by estimation, is 30 to 35%. The left ventricle has moderately decreased function. The left ventricle demonstrates regional wall motion abnormalities (see scoring diagram/findings for description). Left ventricular  diastolic parameters were normal.  4. Right ventricular systolic function is normal. The right ventricular size is normal. Moderately increased right ventricular wall thickness. Tricuspid regurgitation signal is inadequate for assessing PA pressure.  5. The mitral valve is grossly normal. No evidence of mitral valve regurgitation. No evidence of mitral stenosis.  6. The aortic valve was not well visualized. Aortic valve regurgitation is not visualized. No aortic stenosis is present.  7. The inferior vena cava is normal in size with <50% respiratory variability, suggesting right atrial pressure of 8 mmHg. Comparison(s): No prior Echocardiogram. FINDINGS  Left Ventricle: Left ventricular ejection fraction, by estimation, is 30 to 35%. The left ventricle has moderately decreased function. The left ventricle demonstrates regional wall motion abnormalities. Definity contrast agent was given IV to delineate the left ventricular endocardial borders. The left ventricular internal cavity size was small. There is no left ventricular hypertrophy. Left ventricular diastolic parameters were normal.  LV Wall Scoring: The entire anterior wall, entire anterior septum, entire apex, mid and distal inferior wall, mid anterolateral segment, and mid inferoseptal segment are hypokinetic. The posterior wall, basal anterolateral segment, basal inferior segment, and basal inferoseptal segment are normal. Right Ventricle: The right ventricular size is normal. Moderately increased right ventricular wall thickness. Right ventricular systolic function is normal. Tricuspid regurgitation signal is inadequate for assessing PA  pressure. Left Atrium: Left atrial size was normal in size. Right Atrium: Right atrial size was normal in size. Pericardium: There is no evidence of pericardial effusion. Mitral Valve: The mitral valve is grossly normal. No evidence of mitral valve regurgitation. No evidence of mitral valve stenosis. MV peak gradient, 6.9 mmHg. The mean mitral valve gradient is 1.0 mmHg. Tricuspid Valve: The tricuspid valve is grossly normal. Tricuspid valve regurgitation is not demonstrated. No evidence of tricuspid stenosis. Aortic Valve: The aortic valve was not well visualized. Aortic  valve regurgitation is not visualized. No aortic stenosis is present. Aortic valve mean gradient measures 3.0 mmHg. Aortic valve peak gradient measures 5.6 mmHg. Aortic valve area, by VTI measures 1.83 cm. Pulmonic Valve: The pulmonic valve was not well visualized. Pulmonic valve regurgitation is not visualized. Aorta: The aortic root and ascending aorta are structurally normal, with no evidence of dilitation. Venous: The inferior vena cava is normal in size with less than 50% respiratory variability, suggesting right atrial pressure of 8 mmHg. IAS/Shunts: The interatrial septum appears to be lipomatous. The atrial septum is grossly normal.  LEFT VENTRICLE PLAX 2D LVIDd:         3.60 cm      Diastology LVIDs:         2.40 cm      LV e' medial:    6.42 cm/s LV PW:         0.90 cm      LV E/e' medial:  9.8 LV IVS:        1.00 cm      LV e' lateral:   5.11 cm/s LVOT diam:     1.70 cm      LV E/e' lateral: 12.3 LV SV:         37 LV SV Index:   24 LVOT Area:     2.27 cm  LV Volumes (MOD) LV vol d, MOD A2C: 95.3 ml LV vol d, MOD A4C: 124.0 ml LV vol s, MOD A2C: 68.1 ml LV vol s, MOD A4C: 73.9 ml LV SV MOD A2C:     27.2 ml LV SV MOD A4C:     124.0 ml LV SV MOD BP:      42.1 ml RIGHT VENTRICLE             IVC RV Basal diam:  2.60 cm     IVC diam: 1.60 cm RV S prime:     18.10 cm/s TAPSE (M-mode): 1.8 cm LEFT ATRIUM             Index        RIGHT ATRIUM           Index LA diam:        2.50 cm 1.59 cm/m   RA Area:     8.56 cm LA Vol (A2C):   21.6 ml 13.72 ml/m  RA Volume:   13.80 ml 8.76 ml/m LA Vol (A4C):   26.6 ml 16.89 ml/m LA Biplane Vol: 24.0 ml 15.24 ml/m  AORTIC VALVE                    PULMONIC VALVE AV Area (Vmax):    1.85 cm     PV Vmax:       0.85 m/s AV Area (Vmean):   1.62 cm     PV Peak grad:  2.9 mmHg AV Area (VTI):     1.83 cm AV Vmax:           118.00 cm/s AV Vmean:          77.100 cm/s AV VTI:            0.203 m AV Peak Grad:      5.6 mmHg AV Mean Grad:      3.0 mmHg LVOT Vmax:         96.00 cm/s LVOT Vmean:        55.100 cm/s LVOT VTI:          0.164 m LVOT/AV VTI  ratio: 0.81  AORTA Ao Root diam: 2.90 cm Ao Asc diam:  3.10 cm MITRAL VALVE MV Area (PHT): 4.57 cm     SHUNTS MV Area VTI:   2.36 cm     Systemic VTI:  0.16 m MV Peak grad:  6.9 mmHg     Systemic Diam: 1.70 cm MV Mean grad:  1.0 mmHg MV Vmax:       1.31 m/s MV Vmean:      43.8 cm/s MV Decel Time: 166 msec MV E velocity: 63.00 cm/s MV A velocity: 138.00 cm/s MV E/A ratio:  0.46 Sunit Tolia Electronically signed by Madonna Large Signature Date/Time: 01/06/2024/5:42:14 PM    Final    DG Chest Port 1 View Result Date: 01/06/2024 EXAM: 1 VIEW(S) XRAY OF THE CHEST 01/06/2024 01:45:00 PM COMPARISON: 10/06/04. i. CLINICAL HISTORY: Pain FINDINGS: LUNGS AND PLEURA: No focal pulmonary opacity. No pulmonary edema. No pleural effusion. No pneumothorax. HEART AND MEDIASTINUM: Calcified aorta. No acute abnormality of the cardiac silhouette. BONES AND SOFT TISSUES: Thoracic degenerative changes. No acute fracture. IMPRESSION: 1. No acute cardiopulmonary process identified. Electronically signed by: Waddell Calk MD 01/06/2024 02:57 PM EST RP Workstation: [id number]   CARDIAC CATHETERIZATION Result Date: 01/06/2024   Prox LAD lesion is 99% stenosed.   A drug-eluting stent was successfully placed using a STENT SYNERGY XD 3.0X16.   Post intervention, there is a 0% residual stenosis. Acute anterior  STEMI secondary to thrombotic sub-total occlusion of the proximal LAD Successful PTCA/DES x proximal LAD No obstructive disease in the Circumflex or RCA Severe LV systolic dysfunction with anterior wall hypokinesis, LVEF around 20-25%. Stable BP, elevated lactic acid with trend down after case Recommendations: Will admit to the ICU. She is hemodynamically stable despite having an elevated lactic acid and LV systolic dysfunction. Case discussed with Shock team. Will not perform right heart cath given her hemodynamic stability. DAPT with ASA and Brilinta for one year. Aggrastat infusion two hours post PCI. Will not start a statin given her history of statin intolerance.     Medications:     Scheduled Medications:  aspirin  81 mg Oral Daily   Chlorhexidine Gluconate Cloth  6 each Topical Daily   free water  500 mL Oral Once   insulin aspart  0-15 Units Subcutaneous TID WC   sodium chloride flush  3 mL Intravenous Q12H   spironolactone  12.5 mg Oral Daily   ticagrelor  90 mg Oral BID    Infusions:  sodium chloride     norepinephrine (LEVOPHED) Adult infusion Stopped (01/07/24 0836)   promethazine (PHENERGAN) injection (IM or IVPB) Stopped (01/06/24 1525)    PRN Medications: sodium chloride, acetaminophen, ondansetron (ZOFRAN) IV, promethazine (PHENERGAN) injection (IM or IVPB), sodium chloride flush    Patient Profile   Laurie Lamb is a 73 y.o. female with HTN, DB and HLD. Admitted with anterior STEMI.   Assessment/Plan  STEMI - LHC 11/7 with prox LAD 99% stenosed. DES successfully placed. LVEF 20-25% - Denies CP - Statin intolerant, will need referral to lipid clinic at discharge. On Lovaza at home. - Continue DAPT for 1 year (ASA +DAPT)   Acute systolic heart failure - LVEF on cath 20-25% - Echo with EF 30-35%, normal RV.  - Weaned off NE today, SBP 90s-100s.  - Start spironolactone 12.5 daily.  - Will need repeat echo in 3 months.   Hypokalemia - Replace K.    Hypertension - Just stopped NE.   DM - Will order SSI  Keep in unit today as just stopped NE, floor tomorrow.    Length of Stay: 1  Ezra Shuck, MD  01/07/2024, 10:46 AM  Advanced Heart Failure Team Pager [phone] (M-F; 7a - 5p)  Please contact [facility] for night-coverage after hours (5p -7a ) and weekends on [email or web address]

## 2024-01-07 NOTE — Telephone Encounter (Signed)
 PHARMACIST LIPID MONITORING  Laurie Lamb is a 73 y.o. y.o. adult admitted on 11/7 with STEMI.  Pharmacy has been consulted to optimize lipid-lowering therapy with the indication of secondary prevention for clinical ASCVD.   Recent Labs: Lipid Panel:     Component Value Date/Time   CHOL 179 01/06/2024 1034   TRIG 201 (H) 01/06/2024 1034   HDL 37 (L) 01/06/2024 1034   CHOLHDL 4.8 01/06/2024 1034   VLDL 40 01/06/2024 1034   LDLCALC 102 (H) 01/06/2024 1034    Hepatic function panel:     Component Value Date/Time   AST 71 (H) 01/06/2024 1629   ALT 21 01/06/2024 1629   ALKPHOS 64 01/06/2024 1629   BILITOT 1.0 01/06/2024 1629   BILIDIR 0.1 01/06/2024 1629   IBILI 0.9 01/06/2024 1629    Serum Creatinine  Creatinine, Ser (mg/dL)  Date Value  88/91/7974 1.11 (H)   Estimated CrCl of Estimated Creatinine Clearance: 37.3 mL/min (A) (by C-G formula based on SCr of 1.11 mg/dL (H)).  Current therapy and lipid therapy tolerance: Current lipid-lowering therapy: none   Previous lipid-lowering therapies (if applicable): none   Documented or reported allergies or intolerances to lipid-lowering therapies (if applicable): intol to multiple    Assessment:    Baseline LDL: 102 Major ASCVD events: Recent ACS in past 12 months  High-Risk Conditions: Age >/= 65 years, Diabetes mellitus, and LDL-C >/= 100 mg/dL despite maximally tolerated statin +/- ezetimibe  Plan:    Statin intensity: statin intolerant  Refer to lipid clinic:   Yes  Olam Chalk Pharm.D. CPP, BCPS Clinical Pharmacist [phone] 01/07/2024 2:20 PM

## 2024-01-07 NOTE — Plan of Care (Signed)
  Problem: Education: Goal: Knowledge of General Education information will improve Description: Including pain rating scale, medication(s)/side effects and non-pharmacologic comfort measures Outcome: Progressing   Problem: Health Behavior/Discharge Planning: Goal: Ability to manage health-related needs will improve Outcome: Progressing   Problem: Clinical Measurements: Goal: Ability to maintain clinical measurements within normal limits will improve Outcome: Progressing Goal: Will remain free from infection Outcome: Progressing Goal: Diagnostic test results will improve Outcome: Progressing Goal: Respiratory complications will improve Outcome: Progressing   Problem: Activity: Goal: Risk for activity intolerance will decrease Outcome: Progressing   Problem: Nutrition: Goal: Adequate nutrition will be maintained Outcome: Progressing   Problem: Coping: Goal: Level of anxiety will decrease Outcome: Progressing   Problem: Elimination: Goal: Will not experience complications related to urinary retention Outcome: Progressing   Problem: Pain Managment: Goal: General experience of comfort will improve and/or be controlled Outcome: Progressing   Problem: Safety: Goal: Ability to remain free from injury will improve Outcome: Progressing   Problem: Skin Integrity: Goal: Risk for impaired skin integrity will decrease Outcome: Progressing

## 2024-01-07 NOTE — Plan of Care (Signed)
   Problem: Education: Goal: Knowledge of General Education information will improve Description: Including pain rating scale, medication(s)/side effects and non-pharmacologic comfort measures Outcome: Progressing   Problem: Skin Integrity: Goal: Risk for impaired skin integrity will decrease Outcome: Progressing

## 2024-01-08 ENCOUNTER — Other Ambulatory Visit: Payer: Self-pay | Admitting: Physician Assistant

## 2024-01-08 ENCOUNTER — Encounter (HOSPITAL_COMMUNITY): Payer: Self-pay | Admitting: Cardiovascular Disease

## 2024-01-08 ENCOUNTER — Other Ambulatory Visit (HOSPITAL_COMMUNITY): Payer: Self-pay

## 2024-01-08 DIAGNOSIS — I2102 ST elevation (STEMI) myocardial infarction involving left anterior descending coronary artery: Secondary | ICD-10-CM

## 2024-01-08 DIAGNOSIS — E785 Hyperlipidemia, unspecified: Secondary | ICD-10-CM

## 2024-01-08 LAB — GLUCOSE, CAPILLARY
Glucose-Capillary: 154 mg/dL — ABNORMAL HIGH (ref 70–99)
Glucose-Capillary: 151 mg/dL — ABNORMAL HIGH (ref 70–99)

## 2024-01-08 LAB — LIPOPROTEIN A (LPA): Lipoprotein (a): 48.4 nmol/L — ABNORMAL HIGH (ref <75.0)

## 2024-01-08 MED ORDER — METOPROLOL SUCCINATE ER 25 MG PO TB24
12.5000 mg | ORAL_TABLET | Freq: Every day | ORAL | 3 refills | Status: AC
  Filled 2024-01-08: qty 30, 60d supply, fill #0

## 2024-01-08 MED ORDER — TICAGRELOR 90 MG PO TABS
90.0000 mg | ORAL_TABLET | Freq: Two times a day (BID) | ORAL | 11 refills | Status: AC
  Filled 2024-01-08: qty 60, 30d supply, fill #0

## 2024-01-08 MED ORDER — SPIRONOLACTONE 25 MG PO TABS
25.0000 mg | ORAL_TABLET | Freq: Every day | ORAL | 11 refills | Status: AC
  Filled 2024-01-08: qty 30, 30d supply, fill #0

## 2024-01-08 MED ORDER — METOPROLOL SUCCINATE ER 25 MG PO TB24
12.5000 mg | ORAL_TABLET | Freq: Every day | ORAL | Status: DC
  Administered 2024-01-08: 12.5 mg via ORAL
  Filled 2024-01-08 (×2): qty 1

## 2024-01-08 MED ORDER — SPIRONOLACTONE 25 MG PO TABS: 25.0000 mg | ORAL_TABLET | Freq: Every day | ORAL | Status: DC

## 2024-01-08 NOTE — Discharge Instructions (Addendum)
 No lifting over 5 lbs for 1 week. No sexual activity for 1 week.  Keep procedure site clean & dry. If you notice increased pain, swelling, bleeding or pus, call/return!  You may shower, but no soaking baths/hot tubs/pools for 1 week.  Hold off on take metformin until Monday   Information about your medication: Brilinta (anti-platelet agent)  Generic Name (Brand): ticagrelor (Brilinta), twice daily medication  PURPOSE: You are taking this medication along with aspirin to lower your chance of having a heart attack, stroke, or blood clots in your heart stent. These can be fatal. Brilinta and aspirin help prevent platelets from sticking together and forming a clot that can block an artery or your stent.   Common SIDE EFFECTS you may experience include: bruising or bleeding more easily, shortness of breath  Do not stop taking BRILINTA without talking to the doctor who prescribes it for you. People who are treated with a stent and stop taking Brilinta too soon, have a higher risk of getting a blood clot in the stent, having a heart attack, or dying. If you stop Brilinta because of bleeding, or for other reasons, your risk of a heart attack or stroke may increase.   Tell all of your doctors and dentists that you are taking Brilinta. They should talk to the doctor who prescribed Brilinta for you before you have any surgery or invasive procedure.   Contact your health care provider if you experience: severe or uncontrollable bleeding, pink/red/brown urine, vomiting blood or vomit that looks like coffee grounds, red or black stools (looks like tar), coughing up blood or blood clots ---------------------------------------------------------------------------------------------------------------------- Information about your medication:  Beta Blocker (Heart rate/Blood Pressure-lowering agent)  Generic Name (Brand): metoprolol succinate (Toprol XL)  PURPOSE: You are taking this medication to lower blood  pressure and heart rate to help prevent further damage to your heart and to reduce your risk of death following your cardiac event.   Common SIDE EFFECTS you may experience include: fatigue, dizziness, diarrhea, or nausea. This medication may also mask the signs of hypoglycemia  Take your medication exactly as prescribed.   Contact your health care provider if you experience: severely lower bllod pressure, syncope, palpitations, or chest pain muscle ----------------------------------------------------------------------------------------------------------------------

## 2024-01-08 NOTE — Progress Notes (Signed)
 Patient discharge instructions reviewed, patient verbalizing understanding. No acute distress noted at this time, VSS. Discharged home with daughter.

## 2024-01-08 NOTE — Discharge Summary (Signed)
 Discharge Summary   Patient ID: Laurie Lamb MRN: [id number]; DOB: 1951-02-12  Admit date: 01/06/2024 Discharge date: 01/08/2024  PCP:  Shona Norleen PEDLAR, MD   [facility] Providers Cardiologist:  Lonni Cash, MD    Discharge Diagnoses  Principal Problem:   Acute ST elevation myocardial infarction (STEMI) due to occlusion of left anterior descending (LAD) coronary artery [facility])   Diagnostic Studies/Procedures  Cath 01/06/2024   Prox LAD lesion is 99% stenosed.   A drug-eluting stent was successfully placed using a STENT SYNERGY XD 3.0X16.   Post intervention, there is a 0% residual stenosis.   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  Stable BP, elevated lactic acid with trend down after case   Recommendations: Will admit to the ICU. She is hemodynamically stable despite having an elevated lactic acid and LV systolic dysfunction. Case discussed with Shock team. Will not perform right heart cath given her hemodynamic stability. DAPT with ASA and Brilinta for one year. Aggrastat infusion two hours post PCI. Will not start a statin given her history of statin intolerance.   Diagnostic Dominance: Right  Intervention     ECHO IMPRESSIONS 01/06/2024  1. Technically challenging study.   2. No left ventricular mural or apical thrombus/thrombi.   3. Left ventricular ejection fraction, by estimation, is 30 to 35%. The  left ventricle has moderately decreased function. The left ventricle  demonstrates regional wall motion abnormalities (see scoring  diagram/findings for description). Left ventricular   diastolic parameters were normal.   4. Right ventricular systolic function is normal. The right ventricular  size is normal. Moderately increased right  ventricular wall thickness.  Tricuspid regurgitation signal is inadequate for assessing PA pressure.   5. The mitral valve is grossly normal. No evidence of mitral valve  regurgitation. No evidence of mitral stenosis.   6. The aortic valve was not well visualized. Aortic valve regurgitation  is not visualized. No aortic stenosis is present.   7. The inferior vena cava is normal in size with <50% respiratory  variability, suggesting right atrial pressure of 8 mmHg.   Comparison(s): No prior Echocardiogram.   _____________   History of Present Illness   Laurie Lamb is a 73 y.o. female with history of DM, HTN and HLD with chest pain on/off for two weeks. Presented to the ED after onset of severe chest pain that morning. She described central chest pressure with radiation to her jaw. EKG with anterior ST elevation with inferior depression. Ongoing chest pain in the ED. Code STEMI activated by ED staff. Pt received ASA 324 mg and IV Heparin in the ED.    Hospital Course   Consultants: Dr. Cash, Dr. Rolan    Anterior STEMI [facility] 11/7 with prox LAD 99% stenosed. DES successfully placed. LVEF 20-25%. Recommend DAPT for 1 year (ASA + DAPT).  ECHO after cath as below: EF 30-35%  Denies any chest pain post cath.  Statin intolerant, will need referral to lipid clinic at discharge. On Lovaza at home. Continue ASA 81 mg daily, Brilinta 90 mg BID, Toprol XL 12.5 mg daily, Spironolactone 25 mg.  Not started on ACE/ARB due to hypotension. Reassess in outpatient visit.  Will plan to add SGTL2i in outpatient follow up as tolerated. Would be beneficial with hx of DM.    New Acute HFrEF  LVEF on cath 20-25% Echo with EF 30-35%, normal RV, no significant valvular disease.  Started spironolactone this admission. Increased Spiro 12.5 mg to 25 mg. Continue Spiro 25 mg daily  Continue Toprol XL as above.  GDMT limited due to hypotension as above.  Will plan to schedule echo in 3 months.     Hypokalemia, resolved  Treated with KCL supplement.  K 2.9 > 3.5   Hypertension Hypotension  D/C Diovan/hydrochlorothiazide 160-12.5 mg and Amlodipine 5 mg  Treated with Levophed x2 days then discontinued with SBP 90-100's. BP stable at 121/72 Continue Toprol XL and Spiro as above.    DM A1C 6.7 On Metformin 500 mg BID, glimepiride 1 mg daily, and Actos 15 mg daily  Managed by primary      Did the patient have an acute coronary syndrome (MI, NSTEMI, STEMI, etc) this admission?:  Yes                               AHA/ACC ACS Clinical Performance & Quality Measures: Aspirin prescribed? - Yes ADP Receptor Inhibitor (Plavix/Clopidogrel, Brilinta/Ticagrelor or Effient/Prasugrel) prescribed (includes medically managed patients)? - Yes Beta Blocker prescribed? - Yes High Intensity Statin (Lipitor 40-80mg  or Crestor 20-40mg ) prescribed? - No - intolerant of statins  EF assessed during THIS hospitalization? - Yes For EF <40%, was ACEI/ARB prescribed? - No - Reason:   Soft BP For EF <40%, Aldosterone Antagonist (Spironolactone or Eplerenone) prescribed? - Yes Cardiac Rehab Phase II ordered (including medically managed patients)? - Yes       The patient will be scheduled for a TOC follow up appointment in 7-14 days.  A message has been sent to the [facility] and Scheduling Pool at the office where the patient should be seen for follow up.  _____________  Discharge Vitals Blood pressure 121/72, pulse 88, temperature 98.7 F (37.1 C), temperature source Oral, resp. rate 15, height 5' (1.524 m), weight 60.8 kg, SpO2 94%.  Filed Weights   01/06/24 1025  Weight: 60.8 kg    Physical Exam  GEN: No acute distress.   Neck: No JVD Cardiac: RRR, no murmurs, rubs, or gallops.  Respiratory: Clear to auscultation bilaterally. GI: Soft, nontender, non-distended  MS: No edema; No deformity. Neuro:  Nonfocal  Psych: Normal affect   Labs & Radiologic Studies  CBC Recent Labs     01/06/24 1034 01/06/24 1145 01/07/24 0230  WBC 11.2*  --  11.7*  HGB 14.3 11.9* 12.6  HCT 43.5 35.0* 38.1  MCV 87.9  --  87.2  PLT 378  --  364   Basic Metabolic Panel Recent Labs    88/92/74 1315 01/07/24 0230  NA 139 139  K 3.5 3.8  CL 105 105  CO2 21* 20*  GLUCOSE 97 218*  BUN 15 15  CREATININE 1.31* 1.11*  CALCIUM 9.2 8.5*  MG 1.6*  --    Liver Function Tests Recent Labs    01/06/24 1629  AST 71*  ALT 21  ALKPHOS 64  BILITOT 1.0  PROT 6.3*  ALBUMIN 3.3*   No results for input(s): LIPASE, AMYLASE in the last 72 hours. High Sensitivity Troponin:   Recent Labs  Lab 01/06/24 1034 01/06/24 1315 01/06/24 1629  TROPONINIHS 1,031* 11,470* 19,849*    No results for input(s): TRNPT in the last 720 hours.  BNP Invalid input(s): POCBNP No results for input(s): PROBNP in the last 72 hours.  No results for input(s): BNP  in the last 72 hours.  D-Dimer No results for input(s): DDIMER in the last 72 hours. Hemoglobin A1C Recent Labs    01/06/24 1034  HGBA1C 6.7*   Fasting Lipid Panel Recent Labs    01/06/24 1034  CHOL 179  HDL 37*  LDLCALC 102*  TRIG 201*  CHOLHDL 4.8   No results found for: LIPOA  Thyroid Function Tests Recent Labs    01/06/24 1315  TSH 2.089   _____________  ECHOCARDIOGRAM COMPLETE Result Date: 01/06/2024    ECHOCARDIOGRAM REPORT   Patient Name:   Laurie Lamb Date of Exam: 01/06/2024 Medical Rec #:  [id number]        Height:       60.0 in Accession #:    [id number]       Weight:       134.0 lb Date of Birth:  1950-05-29        BSA:          1.574 m Patient Age:    73 years         BP:           116/65 mmHg Patient Gender: F                HR:           86 bpm. Exam Location:  Inpatient Procedure: 2D Echo, Cardiac Doppler, Color Doppler and Intracardiac            Opacification Agent (Both Spectral and Color Flow Doppler were            utilized during procedure). Indications:    CAD I25.10  History:        Patient  has no prior history of Echocardiogram examinations. CAD                 and STEMI; Risk Factors:Diabetes and Hypertension.  Sonographer:    BERNARDA ROCKS Referring Phys: 38 CHRISTOPHER D MCALHANY  Sonographer Comments: Technically challenging study due to limited acoustic windows. IMPRESSIONS  1. Technically challenging study.  2. No left ventricular mural or apical thrombus/thrombi.  3. Left ventricular ejection fraction, by estimation, is 30 to 35%. The left ventricle has moderately decreased function. The left ventricle demonstrates regional wall motion abnormalities (see scoring diagram/findings for description). Left ventricular  diastolic parameters were normal.  4. Right ventricular systolic function is normal. The right ventricular size is normal. Moderately increased right ventricular wall thickness. Tricuspid regurgitation signal is inadequate for assessing PA pressure.  5. The mitral valve is grossly normal. No evidence of mitral valve regurgitation. No evidence of mitral stenosis.  6. The aortic valve was not well visualized. Aortic valve regurgitation is not visualized. No aortic stenosis is present.  7. The inferior vena cava is normal in size with <50% respiratory variability, suggesting right atrial pressure of 8 mmHg. Comparison(s): No prior Echocardiogram. FINDINGS  Left Ventricle: Left ventricular ejection fraction, by estimation, is 30 to 35%. The left ventricle has moderately decreased function. The left ventricle demonstrates regional wall motion abnormalities. Definity contrast agent was given IV to delineate the left ventricular endocardial borders. The left ventricular internal cavity size was small. There is no left ventricular hypertrophy. Left ventricular diastolic parameters were normal.  LV Wall Scoring: The entire anterior wall, entire anterior septum, entire apex, mid and distal inferior wall, mid anterolateral segment, and mid inferoseptal segment are hypokinetic. The posterior  wall, basal anterolateral segment, basal inferior segment, and basal inferoseptal segment are normal. Right  Ventricle: The right ventricular size is normal. Moderately increased right ventricular wall thickness. Right ventricular systolic function is normal. Tricuspid regurgitation signal is inadequate for assessing PA pressure. Left Atrium: Left atrial size was normal in size. Right Atrium: Right atrial size was normal in size. Pericardium: There is no evidence of pericardial effusion. Mitral Valve: The mitral valve is grossly normal. No evidence of mitral valve regurgitation. No evidence of mitral valve stenosis. MV peak gradient, 6.9 mmHg. The mean mitral valve gradient is 1.0 mmHg. Tricuspid Valve: The tricuspid valve is grossly normal. Tricuspid valve regurgitation is not demonstrated. No evidence of tricuspid stenosis. Aortic Valve: The aortic valve was not well visualized. Aortic valve regurgitation is not visualized. No aortic stenosis is present. Aortic valve mean gradient measures 3.0 mmHg. Aortic valve peak gradient measures 5.6 mmHg. Aortic valve area, by VTI measures 1.83 cm. Pulmonic Valve: The pulmonic valve was not well visualized. Pulmonic valve regurgitation is not visualized. Aorta: The aortic root and ascending aorta are structurally normal, with no evidence of dilitation. Venous: The inferior vena cava is normal in size with less than 50% respiratory variability, suggesting right atrial pressure of 8 mmHg. IAS/Shunts: The interatrial septum appears to be lipomatous. The atrial septum is grossly normal.  LEFT VENTRICLE PLAX 2D LVIDd:         3.60 cm      Diastology LVIDs:         2.40 cm      LV e' medial:    6.42 cm/s LV PW:         0.90 cm      LV E/e' medial:  9.8 LV IVS:        1.00 cm      LV e' lateral:   5.11 cm/s LVOT diam:     1.70 cm      LV E/e' lateral: 12.3 LV SV:         37 LV SV Index:   24 LVOT Area:     2.27 cm  LV Volumes (MOD) LV vol d, MOD A2C: 95.3 ml LV vol d, MOD A4C:  124.0 ml LV vol s, MOD A2C: 68.1 ml LV vol s, MOD A4C: 73.9 ml LV SV MOD A2C:     27.2 ml LV SV MOD A4C:     124.0 ml LV SV MOD BP:      42.1 ml RIGHT VENTRICLE             IVC RV Basal diam:  2.60 cm     IVC diam: 1.60 cm RV S prime:     18.10 cm/s TAPSE (M-mode): 1.8 cm LEFT ATRIUM             Index        RIGHT ATRIUM          Index LA diam:        2.50 cm 1.59 cm/m   RA Area:     8.56 cm LA Vol (A2C):   21.6 ml 13.72 ml/m  RA Volume:   13.80 ml 8.76 ml/m LA Vol (A4C):   26.6 ml 16.89 ml/m LA Biplane Vol: 24.0 ml 15.24 ml/m  AORTIC VALVE                    PULMONIC VALVE AV Area (Vmax):    1.85 cm     PV Vmax:       0.85 m/s AV Area (Vmean):   1.62 cm  PV Peak grad:  2.9 mmHg AV Area (VTI):     1.83 cm AV Vmax:           118.00 cm/s AV Vmean:          77.100 cm/s AV VTI:            0.203 m AV Peak Grad:      5.6 mmHg AV Mean Grad:      3.0 mmHg LVOT Vmax:         96.00 cm/s LVOT Vmean:        55.100 cm/s LVOT VTI:          0.164 m LVOT/AV VTI ratio: 0.81  AORTA Ao Root diam: 2.90 cm Ao Asc diam:  3.10 cm MITRAL VALVE MV Area (PHT): 4.57 cm     SHUNTS MV Area VTI:   2.36 cm     Systemic VTI:  0.16 m MV Peak grad:  6.9 mmHg     Systemic Diam: 1.70 cm MV Mean grad:  1.0 mmHg MV Vmax:       1.31 m/s MV Vmean:      43.8 cm/s MV Decel Time: 166 msec MV E velocity: 63.00 cm/s MV A velocity: 138.00 cm/s MV E/A ratio:  0.46 Sunit Tolia Electronically signed by Madonna Large Signature Date/Time: 01/06/2024/5:42:14 PM    Final    DG Chest Port 1 View Result Date: 01/06/2024 EXAM: 1 VIEW(S) XRAY OF THE CHEST 01/06/2024 01:45:00 PM COMPARISON: 10/06/04. i. CLINICAL HISTORY: Pain FINDINGS: LUNGS AND PLEURA: No focal pulmonary opacity. No pulmonary edema. No pleural effusion. No pneumothorax. HEART AND MEDIASTINUM: Calcified aorta. No acute abnormality of the cardiac silhouette. BONES AND SOFT TISSUES: Thoracic degenerative changes. No acute fracture. IMPRESSION: 1. No acute cardiopulmonary process identified.  Electronically signed by: Waddell Calk MD 01/06/2024 02:57 PM EST RP Workstation: [id number]   CARDIAC CATHETERIZATION Result Date: 01/06/2024   Prox LAD lesion is 99% stenosed.   A drug-eluting stent was successfully placed using a STENT SYNERGY XD 3.0X16.   Post intervention, there is a 0% residual stenosis. 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 Stable BP, elevated lactic acid with trend down after case Recommendations: Will admit to the ICU. She is hemodynamically stable despite having an elevated lactic acid and LV systolic dysfunction. Case discussed with Shock team. Will not perform right heart cath given her hemodynamic stability. DAPT with ASA and Brilinta for one year. Aggrastat infusion two hours post PCI. Will not start a statin given her history of statin intolerance.    Disposition Pt is being discharged home today in good condition.  Follow-up Plans & Appointments  Follow-up Information     Janene Boer, [place] Follow up on 01/19/2024.   Specialties: Cardiology, Radiology Why: 2:45PM. Cardiology follow up Contact information: [address] [place] [address] [phone]                Discharge Instructions     AMB Referral to [facility]   Complete by: As directed    Melissa or vishalli  - STEMI   Reason For Referral: Lipids   Amb Referral to Cardiac Rehabilitation   Complete by: As directed    Diagnosis:  Coronary Stents STEMI     After initial evaluation and assessments completed: Virtual Based Care may be provided alone or  in conjunction with Phase 2 Cardiac Rehab based on patient barriers.: Yes   Intensive Cardiac Rehabilitation (ICR) [facility] location only OR Traditional Cardiac Rehabilitation (TCR) *If criteria for ICR are not met will enroll  in TCR [facility] only): Yes       Discharge Medications Allergies as of 01/08/2024       Reactions   Amlodipine    Reported as can't take made dizzy   Antihistamines, Loratadine-type    Ezetimibe    Causes neck pain   Glipizide    Does not remeber   Januvia [sitagliptin]    Does not remeber   Levocetirizine    Lovastatin    Pains    Pioglitazone    Does not remember - stopped after 1 yr   Pravastatin    Muscle pains   Rosuvastatin    Muscle pains        Medication List     STOP taking these medications    amLODipine 5 MG tablet Commonly known as: NORVASC   pioglitazone 15 MG tablet Commonly known as: ACTOS   valsartan-hydrochlorothiazide 160-12.5 MG tablet Commonly known as: DIOVAN-HCT       TAKE these medications    acetaminophen 500 MG tablet Commonly known as: TYLENOL Take 500 mg by mouth daily as needed for mild pain (pain score 1-3).   aspirin EC 81 MG tablet Take 81 mg by mouth daily. Swallow whole.   calcium carbonate 1500 (600 Ca) MG Tabs tablet Commonly known as: OSCAL Take 600 mg of elemental calcium by mouth 2 (two) times daily with a meal.   glimepiride 1 MG tablet Commonly known as: AMARYL Take 1 mg by mouth daily with breakfast.   metFORMIN 500 MG tablet Commonly known as: GLUCOPHAGE Take by mouth 2 (two) times daily with a meal.   metoprolol succinate 25 MG 24 hr tablet Commonly known as: TOPROL-XL Take 0.5 tablets (12.5 mg total) by mouth daily. Start taking on: January 09, 2024   omega-3 acid ethyl esters 1 g capsule Commonly known as: LOVAZA Take 1 g by mouth 2 (two) times daily.   spironolactone 25 MG tablet Commonly known as: ALDACTONE Take 1 tablet (25 mg total) by mouth daily. Start taking on: January 09, 2024   ticagrelor 90 MG Tabs tablet Commonly known as: BRILINTA Take 1 tablet (90 mg total) by mouth 2 (two) times daily.         Outstanding Labs/Studies BMP  Follow up ECHO in 3 months  Lipid Clinic  referral   Duration of Discharge Encounter: APP Time: 25 minutes   Signed, Scot Ford, PA 01/08/2024, 2:39 PM

## 2024-01-09 ENCOUNTER — Telehealth: Payer: Self-pay

## 2024-01-09 NOTE — Transitions of Care (Post Inpatient/ED Visit) (Signed)
 01/09/2024  Name: Laurie Lamb MRN: [id number] DOB: January 09, 1951  Today's TOC FU Call Status: Today's TOC FU Call Status:: Successful TOC FU Call Completed TOC FU Call Complete Date: 01/09/24 Patient's Name and Date of Birth confirmed.  Transition Care Management Follow-up Telephone Call Date of Discharge: 01/08/24 Discharge Facility: [facility]) Type of Discharge: Inpatient Admission Primary Inpatient Discharge Diagnosis:: Acute ST elevation myocardial infarction STEMI How have you been since you were released from the hospital?: Better  Items Reviewed: Did you receive and understand the discharge instructions provided?: No Medications obtained,verified, and reconciled?: Yes (Medications Reviewed) Any new allergies since your discharge?: No  Medications Reviewed Today: Medications Reviewed Today     Reviewed by Eilleen Richerd GRADE, RN (Registered Nurse) on 01/09/24 at 1419  Med List Status: <None>   Medication Order Taking? Sig Documenting Provider Last Dose Status Informant  acetaminophen (TYLENOL) 500 MG tablet [id number] Yes Take 500 mg by mouth daily as needed for mild pain (pain score 1-3). [provider]  Active   aspirin EC 81 MG tablet [id number] Yes Take 81 mg by mouth daily. Swallow whole. [provider]  Active Self  calcium carbonate (OSCAL) 1500 (600 Ca) MG TABS tablet [id number] Yes Take 600 mg of elemental calcium by mouth 2 (two) times daily with a meal. [provider]  Active   glimepiride (AMARYL) 1 MG tablet [id number] Yes Take 1 mg by mouth daily with breakfast. [provider]  Active Self  metFORMIN (GLUCOPHAGE) 500 MG tablet [id number] Yes Take by mouth 2 (two) times daily with a meal. [provider]  Active   metoprolol succinate (TOPROL-XL) 25 MG 24 hr tablet [id number] Yes Take 0.5 tablets (12.5 mg total) by mouth daily. Meng, Hao, [place]  Active   omega-3 acid ethyl esters (LOVAZA) 1 g capsule [id number] Yes Take 1  g by mouth 2 (two) times daily. [provider]  Active   spironolactone (ALDACTONE) 25 MG tablet [id number] Yes Take 1 tablet (25 mg total) by mouth daily. Meng, Hao, [place]  Active   ticagrelor (BRILINTA) 90 MG TABS tablet [id number] Yes Take 1 tablet (90 mg total) by mouth 2 (two) times daily. Janene Boer, [place]  Active             Home Care and Equipment/Supplies: Were Home Health Services Ordered?: [facility]  Functional Questionnaire: Do you need assistance with bathing/showering or dressing?: No Do you need assistance with meal preparation?: No Do you need assistance with eating?: No Do you have difficulty managing or taking your medications?: No  Follow up appointments reviewed: PCP Follow-up appointment confirmed?: No (I don't planning on calling for an appointment until after I see the cardiologist in [facility] Attempted to explain reason for HFU however she declines assistance when offered to make appointment.) Specialist Hospital Follow-up appointment confirmed?: Yes Date of Specialist follow-up appointment?: 01/19/24 Follow-Up Specialty Provider:: [facility] Do you need transportation to your follow-up appointment?: No Do you understand care options if your condition(s) worsen?: Yes-patient verbalized understanding  SDOH Interventions Today    Flowsheet Row Most Recent Value  SDOH Interventions   Food Insecurity Interventions Intervention Not Indicated  Housing Interventions Intervention Not Indicated  Transportation Interventions Intervention Not Indicated  Utilities Interventions Intervention Not Indicated   Education for self-mgmt of MI/STEMI provided during this outreach regarding: -s/s of worsening condition and when to seek medical attention -importance of completing all post discharge hospital hospital follow up appts -adherence to med regimen VBCI-[facility]  [facility] TOC 30-day program enrollment reviewed and discussed with pt/caregiver. They have declined  enrollment in 30 day TOC program due to:  Richerd Fish, RN, BSN, CCM [facility], [facility] Management Coordinator Direct Dial: [phone]

## 2024-01-19 ENCOUNTER — Telehealth: Payer: Self-pay | Admitting: Pharmacy Technician

## 2024-01-19 ENCOUNTER — Other Ambulatory Visit (HOSPITAL_COMMUNITY): Payer: Self-pay

## 2024-01-19 ENCOUNTER — Encounter: Payer: Self-pay | Admitting: Physician Assistant

## 2024-01-19 ENCOUNTER — Encounter: Payer: Self-pay | Admitting: Pharmacist

## 2024-01-19 ENCOUNTER — Telehealth: Payer: Self-pay | Admitting: Pharmacist

## 2024-01-19 ENCOUNTER — Ambulatory Visit: Admitting: Pharmacist

## 2024-01-19 ENCOUNTER — Ambulatory Visit: Attending: Physician Assistant | Admitting: Physician Assistant

## 2024-01-19 VITALS — BP 130/72 | HR 69 | Ht 60.0 in | Wt 132.2 lb

## 2024-01-19 DIAGNOSIS — I255 Ischemic cardiomyopathy: Secondary | ICD-10-CM

## 2024-01-19 DIAGNOSIS — E7849 Other hyperlipidemia: Secondary | ICD-10-CM

## 2024-01-19 DIAGNOSIS — E785 Hyperlipidemia, unspecified: Secondary | ICD-10-CM | POA: Insufficient documentation

## 2024-01-19 DIAGNOSIS — E119 Type 2 diabetes mellitus without complications: Secondary | ICD-10-CM

## 2024-01-19 DIAGNOSIS — I10 Essential (primary) hypertension: Secondary | ICD-10-CM

## 2024-01-19 DIAGNOSIS — I2510 Atherosclerotic heart disease of native coronary artery without angina pectoris: Secondary | ICD-10-CM

## 2024-01-19 MED ORDER — DAPAGLIFLOZIN PROPANEDIOL 10 MG PO TABS: 10.0000 mg | ORAL_TABLET | Freq: Every day | ORAL | 3 refills | Status: DC

## 2024-01-19 MED ORDER — REPATHA SURECLICK 140 MG/ML ~~LOC~~ SOAJ
140.0000 mg | SUBCUTANEOUS | 3 refills | Status: DC
  Filled 2024-01-19: qty 6, 84d supply, fill #0

## 2024-01-19 MED ORDER — LOSARTAN POTASSIUM 25 MG PO TABS: 25.0000 mg | ORAL_TABLET | Freq: Every day | ORAL | 3 refills | Status: DC

## 2024-01-19 NOTE — Telephone Encounter (Signed)
 Patient Advocate Encounter   The patient was approved for a [employer] grant that will help cover the cost of repatha Total amount awarded, 2500.  Effective: 12/20/23 - 12/18/24   APW:[id number] ERW:[id number] Hmnle:[id number] PI:[id number] [employer] ID: [id number]   Pharmacy provided with approval and processing information. Patient informed via jolaine Ferretti in [employer]

## 2024-01-19 NOTE — Assessment & Plan Note (Signed)
 Assessment:  LDL goal: < 55 mg/dl last LDLc 897 mg/dl (88/92/7974) Currently not on any medications   Intolerance to statins - due to myalgia  Discussed next potential options (PCSK-9 inhibitors, bempedoic acid and inclisiran); cost, dosing efficacy, side effects  Reiterated importance of heart healthy diet and regular exercise   Will enrolled in the grant to make Repatha  affordable   Plan: Start taking Repatha  140 mg every 14 days  Lipid lab due in 2-3 months after starting PCSK9i

## 2024-01-19 NOTE — Telephone Encounter (Signed)
 Pharmacy Patient Advocate Encounter  Received notification from [facility] that Prior Authorization for repatha has been APPROVED from 01/19/24 to 07/18/24. Ran test claim, Copay is $387.00. This test claim was processed through [facility]- copay amounts may vary at other pharmacies due to pharmacy/plan contracts, or as the patient moves through the different stages of their insurance plan.   PA #/Case ID/Reference #: [id number]

## 2024-01-19 NOTE — Telephone Encounter (Signed)
 Patient Advocate Encounter   The patient was approved for a [employer] grant that will help cover the cost of repatha Total amount awarded, 2500.  Effective: 12/20/23 - 12/18/24   APW:[id number] ERW:[id number] Hmnle:[id number] PI:[id number] [employer] ID: [id number]   Pharmacy provided with approval and processing information. Grant in [employer]

## 2024-01-19 NOTE — Telephone Encounter (Signed)
   Pharmacy Patient Advocate Encounter   Received notification from Pt Calls Messages that prior authorization for repatha is required/requested.   Insurance verification completed.   The patient is insured through [employer].   Per test claim: PA required; PA submitted to above mentioned insurance via Latent Key/confirmation #/EOC [id number] Status is pending

## 2024-01-19 NOTE — Patient Instructions (Signed)
 Medication Instructions:  START LOSARTAN  25 MG DAILY  START FARXIGA  10 MG DAILY *If you need a refill on your cardiac medications before your next appointment, please call your pharmacy*  Lab Work: NO LABS If you have labs (blood work) drawn today and your tests are completely normal, you will receive your results only by: MyChart Message (if you have MyChart) OR A paper copy in the mail If you have any lab test that is abnormal or we need to change your treatment, we will call you to review the results.  Testing/Procedures:[address] -IN 3 MONTHS Your physician has requested that you have an echocardiogram. Echocardiography is a painless test that uses sound waves to create images of your heart. It provides your doctor with information about the size and shape of your heart and how well your heart's chambers and valves are working. This procedure takes approximately one hour. There are no restrictions for this procedure. Please do NOT wear cologne, perfume, aftershave, or lotions (deodorant is allowed). Please arrive 15 minutes prior to your appointment time.  Please note: We ask at that you not bring children with you during ultrasound (echo/ vascular) testing. Due to room size and safety concerns, children are not allowed in the ultrasound rooms during exams. Our front office staff cannot provide observation of children in our lobby area while testing is being conducted. An adult accompanying a patient to their appointment will only be allowed in the ultrasound room at the discretion of the ultrasound technician under special circumstances. We apologize for any inconvenience.   Follow-Up: At [facility], you and your health needs are our priority.  As part of our continuing mission to provide you with exceptional heart care, our providers are all part of one team.  This team includes your primary Cardiologist (physician) and Advanced Practice Providers or APPs (Physician  Assistants and Nurse Practitioners) who all work together to provide you with the care you need, when you need it.  Your next appointment:   6 week(s)  Provider:   Hao Meng, PA-C      Then, Lonni Cash, MD will plan to see you again in 3 month(s) after echocardiogram.

## 2024-01-19 NOTE — Progress Notes (Signed)
 Cardiology Office Note   Date:  01/21/2024  ID:  Mckynzie, Liwanag 1950-05-03, MRN [id number] PCP: Shona Norleen PEDLAR, MD  [facility] Providers Cardiologist:  Lonni Cash, MD     History of Present Illness SHERIL HAMMOND is a 73 y.o. female with PMH of hypertension, hyperlipidemia, DM2 and recently diagnosed CAD.  She presented to the hospital recently with on and off chest pain for 2 weeks.  EKG showed anterior ST elevation with inferior ST depression.  Code STEMI was activated.  He was taken urgently for cardiac catheterization on 01/06/2024 which showed 99% proximal LAD stenosis successfully treated with DES, EF 20 to 25%.  Echocardiogram obtained after the cardiac authorization was 30 to 35% ejection fraction.  He is statin intolerant and only takes Lovaza at home.  She was referred to lipid clinic for consideration of SGLT2 inhibitor.  Metoprolol  and spironolactone  was started during this hospitalization.  So plans to start on SGLT2 inhibitor and ARNI as outpatient.  She has been seen by lipid clinic this morning who started him on Repatha .  Patient presents today for follow-up.  She denies any chest pain.  According to the patient, she has tried to started on Jardiance in the past however was too expensive.  I will try Farxiga  and losartan .  Although blood pressure is he is in the 130s in the office today, blood pressure has been borderline low at home.  If she is able to tolerate losartan , then I plan to transition her to [facility] on the next follow-up.  Otherwise, she has no lower extremity edema, orthopnea or PND.  I plan to repeat limited echocardiogram in 87-month to reassess ejection fraction.   ROS:   She denies chest pain, palpitations, dyspnea, pnd, orthopnea, n, v, dizziness, syncope, edema, weight gain, or early satiety. All other systems reviewed and are otherwise negative except as noted above.    Studies Reviewed EKG Interpretation Date/Time:  Thursday  January 19 2024 15:32:33 EST Ventricular Rate:  69 PR Interval:  164 QRS Duration:  82 QT Interval:  418 QTC Calculation: 447 R Axis:   60  Text Interpretation: Normal sinus rhythm T wave abnormality, consider anterolateral ischemia When compared with ECG of 07-Jan-2024 06:50, Questionable change in QRS axis Non-specific change in ST segment in Inferior leads T wave inversion no longer evident in Inferior leads T wave inversion less evident in Lateral leads QT has shortened Confirmed by Shahrukh Pasch [id number]) on 01/19/2024 3:35:00 PM    Cardiac Studies & Procedures   ______________________________________________________________________________________________ CARDIAC CATHETERIZATION  CARDIAC CATHETERIZATION 01/06/2024  Conclusion   Prox LAD lesion is 99% stenosed.   A drug-eluting stent was successfully placed using a STENT SYNERGY XD 3.0X16.   Post intervention, there is a 0% residual stenosis.  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 Stable BP, elevated lactic acid with trend down after case  Recommendations: Will admit to the ICU. She is hemodynamically stable despite having an elevated lactic acid and LV systolic dysfunction. Case discussed with Shock team. Will not perform right heart cath given her hemodynamic stability. DAPT with ASA and Brilinta  for one year. Aggrastat  infusion two hours post PCI. Will not start a statin given her history of statin intolerance.  Findings Coronary Findings Diagnostic  Dominance: Right  Left Anterior Descending Vessel is large. Prox  LAD lesion is 99% stenosed.  Left Circumflex Vessel is large.  Right Coronary Artery Vessel is large.  Intervention  Prox LAD lesion Stent CATH VISTA GUIDE 6FR XBLD 3.5 guide catheter was inserted.  Lesion crossed with guidewire using a WIRE ASAHI PROWATER 180CM. Pre-stent angioplasty was performed using a BALLOON EMERGE MR 2.0X12. A drug-eluting stent was successfully placed using a STENT SYNERGY XD 3.0X16. Post-stent angioplasty was performed using a BALLOON Bollinger EMERGE MR [id number]. Post-Intervention Lesion Assessment The intervention was successful. Pre-interventional TIMI flow is 1. Post-intervention TIMI flow is 3. No complications occurred at this lesion. There is a 0% residual stenosis post intervention.     ECHOCARDIOGRAM  ECHOCARDIOGRAM COMPLETE 01/06/2024  Narrative ECHOCARDIOGRAM REPORT    Patient Name:   RICHEL MILLSPAUGH Date of Exam: 01/06/2024 Medical Rec #:  [id number]        Height:       60.0 in Accession #:    [id number]       Weight:       134.0 lb Date of Birth:  1950-12-08        BSA:          1.574 m Patient Age:    73 years         BP:           116/65 mmHg Patient Gender: F                HR:           86 bpm. Exam Location:  Inpatient  Procedure: 2D Echo, Cardiac Doppler, Color Doppler and Intracardiac Opacification Agent (Both Spectral and Color Flow Doppler were utilized during procedure).  Indications:    CAD I25.10  History:        Patient has no prior history of Echocardiogram examinations. CAD and STEMI; Risk Factors:Diabetes and Hypertension.  Sonographer:    BERNARDA ROCKS Referring Phys: 59 CHRISTOPHER D MCALHANY   Sonographer Comments: Technically challenging study due to limited acoustic windows. IMPRESSIONS   1. Technically challenging study. 2. No left ventricular mural or apical thrombus/thrombi. 3. Left ventricular ejection fraction, by estimation, is 30 to 35%. The left ventricle has moderately decreased function. The left ventricle demonstrates regional wall motion abnormalities (see scoring diagram/findings for description). Left ventricular diastolic parameters were normal. 4. Right ventricular systolic function is normal. The  right ventricular size is normal. Moderately increased right ventricular wall thickness. Tricuspid regurgitation signal is inadequate for assessing PA pressure. 5. The mitral valve is grossly normal. No evidence of mitral valve regurgitation. No evidence of mitral stenosis. 6. The aortic valve was not well visualized. Aortic valve regurgitation is not visualized. No aortic stenosis is present. 7. The inferior vena cava is normal in size with <50% respiratory variability, suggesting right atrial pressure of 8 mmHg.  Comparison(s): No prior Echocardiogram.  FINDINGS Left Ventricle: Left ventricular ejection fraction, by estimation, is 30 to 35%. The left ventricle has moderately decreased function. The left ventricle demonstrates regional wall motion abnormalities. Definity  contrast agent was given IV to delineate the left ventricular endocardial borders. The left ventricular internal cavity size was small. There is no left ventricular hypertrophy. Left ventricular diastolic parameters were normal.   LV Wall Scoring: The entire anterior wall, entire anterior septum, entire apex, mid and distal inferior wall, mid anterolateral segment, and mid inferoseptal segment are hypokinetic. The posterior wall, basal anterolateral segment, basal inferior segment, and basal inferoseptal segment are normal.  Right Ventricle: The right ventricular size is  normal. Moderately increased right ventricular wall thickness. Right ventricular systolic function is normal. Tricuspid regurgitation signal is inadequate for assessing PA pressure.  Left Atrium: Left atrial size was normal in size.  Right Atrium: Right atrial size was normal in size.  Pericardium: There is no evidence of pericardial effusion.  Mitral Valve: The mitral valve is grossly normal. No evidence of mitral valve regurgitation. No evidence of mitral valve stenosis. MV peak gradient, 6.9 mmHg. The mean mitral valve gradient is 1.0 mmHg.  Tricuspid  Valve: The tricuspid valve is grossly normal. Tricuspid valve regurgitation is not demonstrated. No evidence of tricuspid stenosis.  Aortic Valve: The aortic valve was not well visualized. Aortic valve regurgitation is not visualized. No aortic stenosis is present. Aortic valve mean gradient measures 3.0 mmHg. Aortic valve peak gradient measures 5.6 mmHg. Aortic valve area, by VTI measures 1.83 cm.  Pulmonic Valve: The pulmonic valve was not well visualized. Pulmonic valve regurgitation is not visualized.  Aorta: The aortic root and ascending aorta are structurally normal, with no evidence of dilitation.  Venous: The inferior vena cava is normal in size with less than 50% respiratory variability, suggesting right atrial pressure of 8 mmHg.  IAS/Shunts: The interatrial septum appears to be lipomatous. The atrial septum is grossly normal.   LEFT VENTRICLE PLAX 2D LVIDd:         3.60 cm      Diastology LVIDs:         2.40 cm      LV e' medial:    6.42 cm/s LV PW:         0.90 cm      LV E/e' medial:  9.8 LV IVS:        1.00 cm      LV e' lateral:   5.11 cm/s LVOT diam:     1.70 cm      LV E/e' lateral: 12.3 LV SV:         37 LV SV Index:   24 LVOT Area:     2.27 cm  LV Volumes (MOD) LV vol d, MOD A2C: 95.3 ml LV vol d, MOD A4C: 124.0 ml LV vol s, MOD A2C: 68.1 ml LV vol s, MOD A4C: 73.9 ml LV SV MOD A2C:     27.2 ml LV SV MOD A4C:     124.0 ml LV SV MOD BP:      42.1 ml  RIGHT VENTRICLE             IVC RV Basal diam:  2.60 cm     IVC diam: 1.60 cm RV S prime:     18.10 cm/s TAPSE (M-mode): 1.8 cm  LEFT ATRIUM             Index        RIGHT ATRIUM          Index LA diam:        2.50 cm 1.59 cm/m   RA Area:     8.56 cm LA Vol (A2C):   21.6 ml 13.72 ml/m  RA Volume:   13.80 ml 8.76 ml/m LA Vol (A4C):   26.6 ml 16.89 ml/m LA Biplane Vol: 24.0 ml 15.24 ml/m AORTIC VALVE                    PULMONIC VALVE AV Area (Vmax):    1.85 cm     PV Vmax:       0.85 m/s AV Area  (Vmean):  1.62 cm     PV Peak grad:  2.9 mmHg AV Area (VTI):     1.83 cm AV Vmax:           118.00 cm/s AV Vmean:          77.100 cm/s AV VTI:            0.203 m AV Peak Grad:      5.6 mmHg AV Mean Grad:      3.0 mmHg LVOT Vmax:         96.00 cm/s LVOT Vmean:        55.100 cm/s LVOT VTI:          0.164 m LVOT/AV VTI ratio: 0.81  AORTA Ao Root diam: 2.90 cm Ao Asc diam:  3.10 cm  MITRAL VALVE MV Area (PHT): 4.57 cm     SHUNTS MV Area VTI:   2.36 cm     Systemic VTI:  0.16 m MV Peak grad:  6.9 mmHg     Systemic Diam: 1.70 cm MV Mean grad:  1.0 mmHg MV Vmax:       1.31 m/s MV Vmean:      43.8 cm/s MV Decel Time: 166 msec MV E velocity: 63.00 cm/s MV A velocity: 138.00 cm/s MV E/A ratio:  0.46  Sunit Tolia Electronically signed by [employer] Signature Date/Time: 01/06/2024/5:42:14 PM    Final          ______________________________________________________________________________________________      Risk Assessment/Calculations          Physical Exam VS:  BP 130/72 (BP Location: Right Arm, Patient Position: Sitting, Cuff Size: Normal)   Pulse 69   Ht 5' (1.524 m)   Wt 132 lb 3.2 oz (60 kg)   BMI 25.82 kg/m        Wt Readings from Last 3 Encounters:  01/19/24 132 lb 3.2 oz (60 kg)  01/06/24 134 lb (60.8 kg)  08/23/17 135 lb (61.2 kg)    GEN: Well nourished, well developed in no acute distress NECK: No JVD; No carotid bruits CARDIAC: RRR, no murmurs, rubs, gallops RESPIRATORY:  Clear to auscultation without rales, wheezing or rhonchi  ABDOMEN: Soft, non-tender, non-distended EXTREMITIES:  No edema; No deformity   ASSESSMENT AND PLAN  CAD: Patient recently underwent DES to proximal LAD.  EF was 30 to 35%.  Emphasis has been placed on compliance with aspirin  and Brilinta   Ischemic cardiomyopathy: Continue spironolactone  and metoprolol  succinate.  Will add Farxiga  and losartan  today.  If the patient is able to tolerate losartan , I plan to switch  losartan  to Entresto on the next follow-up in 6 weeks.  Plan for repeat limited echocardiogram in 3 months prior to her follow-up with Dr. Verlin.  Hypertension: Blood pressure borderline based on home blood pressure diary, instead of adding Entresto, I will add losartan  for now and potentially switch to Entresto on the next follow-up  Hyperlipidemia: Intolerant of statins, patient has been started on Repatha   DM2: Managed by primary care provider.  He was started on Farxiga  for heart failure therapy.    Cardiac Rehabilitation Eligibility Assessment  The patient is ready to start cardiac rehabilitation from a cardiac standpoint.       Dispo: Follow-up with me in 6 weeks, follow-up with Dr. Verlin in 3 months.  Signed, Scot Ford, PA

## 2024-01-19 NOTE — Progress Notes (Signed)
 Patient ID: SKY PRIMO                 DOB: [birth year]                    MRN: [id number]      HPI: Laurie Lamb is a 73 y.o. female patient referred to lipid clinic by Dr.McAlhany . PMH is significant for T2DM, ACS- STEMI on 01/06/2024 with successful LAD PCI, Post vascularization echocardiogram with EF 30 to 35%. Has been started on spironolactone and low-dose metoprolol, blood pressure limiting for RAAS inhibition. LDLc was 102. Patient presented with her daughter today. Has tried multiple statins the past but unable to tolerate due to myalgia. She was taking Lovaza but she did not like fishy burps. So she had stopped taking it long time. She likes fried food and eats out 2-3 times per week. Willing to change dietary habits   Reviewed options for lowering LDL cholesterol, including ezetimibe, PCSK-9 inhibitors, bempedoic acid and inclisiran.  Discussed mechanisms of action, dosing, side effects and potential decreases in LDL cholesterol.  Also reviewed cost information and potential options for patient assistance.  Current Medications: none  Intolerances: Lovastatin, pravastatin, rosuvastatin -myalgia  Risk Factors: recent STEMI and T2DM  LDL goal: <55 mg/dl Last lipid lab: TC 820, TG 201, HDLc 37, LDLc 102   Diet: b- eggs and toast or oats  L- soups and salads Snacks -nabs,  Broccoli green beans,  Drink: sweet tea,    Exercise: none will be  going to rehab    Social History:  Alcohol:none Smoking: none  Labs:  Lipid Panel     Component Value Date/Time   CHOL 179 01/06/2024 1034   TRIG 201 (H) 01/06/2024 1034   HDL 37 (L) 01/06/2024 1034   CHOLHDL 4.8 01/06/2024 1034   VLDL 40 01/06/2024 1034   LDLCALC 102 (H) 01/06/2024 1034    Past Medical History:  Diagnosis Date   Arthritis    Diabetes mellitus without complication (HCC)    Hypertension     Current Outpatient Medications on File Prior to Visit  Medication Sig Dispense Refill   acetaminophen (TYLENOL)  500 MG tablet Take 500 mg by mouth daily as needed for mild pain (pain score 1-3).     aspirin EC 81 MG tablet Take 81 mg by mouth daily. Swallow whole.     calcium carbonate (OSCAL) 1500 (600 Ca) MG TABS tablet Take 600 mg of elemental calcium by mouth 2 (two) times daily with a meal.     glimepiride (AMARYL) 1 MG tablet Take 1 mg by mouth daily with breakfast.     metFORMIN (GLUCOPHAGE) 500 MG tablet Take by mouth 2 (two) times daily with a meal.     metoprolol succinate (TOPROL-XL) 25 MG 24 hr tablet Take 0.5 tablets (12.5 mg total) by mouth daily. 30 tablet 3   omega-3 acid ethyl esters (LOVAZA) 1 g capsule Take 1 g by mouth 2 (two) times daily.     spironolactone (ALDACTONE) 25 MG tablet Take 1 tablet (25 mg total) by mouth daily. 30 tablet 11   ticagrelor (BRILINTA) 90 MG TABS tablet Take 1 tablet (90 mg total) by mouth 2 (two) times daily. 60 tablet 11   No current facility-administered medications on file prior to visit.    Allergies  Allergen Reactions   Amlodipine     Reported as can't take made dizzy   Antihistamines, Loratadine-Type    Ezetimibe  Causes neck pain   Glipizide     Does not remeber   Januvia [Sitagliptin]     Does not remeber   Levocetirizine    Lovastatin     Pains    Pioglitazone     Does not remember - stopped after 1 yr   Pravastatin     Muscle pains   Rosuvastatin     Muscle pains    Assessment/Plan:  1. Hyperlipidemia -  Problem  Hyperlipidemia   Current Medications: none  Intolerances: Lovastatin, pravastatin, rosuvastatin -myalgia  Risk Factors: recent STEMI and T2DM  LDL goal: <55 mg/dl Last lipid lab: TC 820, TG 201, HDLc 37, LDLc 102     Hyperlipidemia Assessment:  LDL goal: < 55 mg/dl last LDLc 897 mg/dl (88/92/7974) Currently not on any medications   Intolerance to statins - due to myalgia  Discussed next potential options (PCSK-9 inhibitors, bempedoic acid and inclisiran); cost, dosing efficacy, side effects  Reiterated  importance of heart healthy diet and regular exercise   Will enrolled in the grant to make Repatha  affordable   Plan: Start taking Repatha  140 mg every 14 days  Lipid lab due in 2-3 months after starting PCSK9i    Thank you,  Robbi Blanch, Pharm.D [facility] [address], [place], [place] [address] Phone: [phone]; Fax: [phone]

## 2024-01-20 ENCOUNTER — Telehealth: Payer: Self-pay | Admitting: Pharmacy Technician

## 2024-01-20 ENCOUNTER — Telehealth: Payer: Self-pay | Admitting: Physician Assistant

## 2024-01-20 NOTE — Telephone Encounter (Signed)
 Pt c/o medication issue:  1. Name of Medication: dapagliflozin  propanediol (FARXIGA ) 10 MG TABS tablet   2. How are you currently taking this medication (dosage and times per day)? As written   3. Are you having a reaction (difficulty breathing--STAT)? No   4. What is your medication issue? Pt called in stating this medication this med is around $78. She states [place], [place] told her to call back if too expensive. Please advise of her options

## 2024-01-20 NOTE — Telephone Encounter (Signed)
 PA for Repatha approved see other encounter

## 2024-01-20 NOTE — Telephone Encounter (Signed)
   Patient Advocate Encounter   The patient was approved for a [employer] grant that will help cover the cost of farxiga  Total amount awarded, 7500.  Effective: 12/21/23 - 12/19/24   APW:[id number] ERW:[id number] Group:[id number] PI:[id number] [employer] ID: [id number]   Pharmacy provided with approval and processing information. Patient informed via telephone

## 2024-01-20 NOTE — Telephone Encounter (Addendum)
 I got the patient a grant and gave the info to the pharmacy and the patient

## 2024-01-23 ENCOUNTER — Encounter (HOSPITAL_COMMUNITY): Payer: Self-pay

## 2024-01-24 ENCOUNTER — Encounter (HOSPITAL_COMMUNITY)
Admission: RE | Admit: 2024-01-24 | Discharge: 2024-01-24 | Disposition: A | Discharge status: Home / Self Care | Source: Ambulatory Visit | Attending: Cardiovascular Disease | Admitting: Cardiovascular Disease

## 2024-01-24 DIAGNOSIS — Z955 Presence of coronary angioplasty implant and graft: Secondary | ICD-10-CM | POA: Insufficient documentation

## 2024-01-24 DIAGNOSIS — I2102 ST elevation (STEMI) myocardial infarction involving left anterior descending coronary artery: Secondary | ICD-10-CM | POA: Insufficient documentation

## 2024-01-24 NOTE — Progress Notes (Signed)
 Completed virtual orientation today.  EP evaluation is scheduled for 02/02/24 at 0830 .  Documentation for diagnosis can be found in [facility] encounter 01/06/24.

## 2024-01-27 ENCOUNTER — Other Ambulatory Visit (HOSPITAL_COMMUNITY): Payer: Self-pay

## 2024-01-28 ENCOUNTER — Other Ambulatory Visit (HOSPITAL_COMMUNITY): Payer: Self-pay

## 2024-01-30 ENCOUNTER — Other Ambulatory Visit (HOSPITAL_COMMUNITY): Payer: Self-pay

## 2024-01-30 ENCOUNTER — Telehealth: Payer: Self-pay | Admitting: Pharmacy Technician

## 2024-01-30 NOTE — Telephone Encounter (Signed)
 [id number] ticagrelor  90MG    But insurance wants brand    I called [employer]: [phone]  Fax: [phone]

## 2024-02-02 ENCOUNTER — Encounter (HOSPITAL_COMMUNITY)
Admission: RE | Admit: 2024-02-02 | Discharge: 2024-02-02 | Disposition: A | Source: Ambulatory Visit | Attending: Cardiovascular Disease

## 2024-02-02 VITALS — Ht 60.0 in | Wt 127.0 lb

## 2024-02-02 DIAGNOSIS — I2102 ST elevation (STEMI) myocardial infarction involving left anterior descending coronary artery: Secondary | ICD-10-CM | POA: Insufficient documentation

## 2024-02-02 DIAGNOSIS — Z955 Presence of coronary angioplasty implant and graft: Secondary | ICD-10-CM | POA: Insufficient documentation

## 2024-02-02 NOTE — Patient Instructions (Signed)
 Patient Instructions  Patient Details  Name: Laurie Lamb MRN: [id number] Date of Birth: April 25, 1950 Referring Provider:  Verlin Lonni BIRCH*  Below are your personal goals for exercise, nutrition, and risk factors. Our goal is to help you stay on track towards obtaining and maintaining these goals. We will be discussing your progress on these goals with you throughout the program.  Initial Exercise Prescription:  Initial Exercise Prescription - 02/02/24 0900       Date of Initial Exercise RX and Referring Provider   Date 02/02/24    Referring Provider Verlin Key MD      Treadmill   MPH 1.4    Grade 0    Minutes 15    METs 1.8      NuStep   Level 2    SPM 50    Minutes 15    METs 2      Prescription Details   Frequency (times per week) 2    Duration Progress to 30 minutes of continuous aerobic without signs/symptoms of physical distress      Intensity   THRR 40-80% of Max Heartrate 99-132    Ratings of Perceived Exertion 11-13    Perceived Dyspnea 0-4      Resistance Training   Training Prescription Yes    Weight 4    Reps 10-15          Exercise Goals: Frequency: Be able to perform aerobic exercise two to three times per week in program working toward 2-5 days per week of home exercise.  Intensity: Work with a perceived exertion of 11 (fairly light) - 15 (hard) while following your exercise prescription.  We will make changes to your prescription with you as you progress through the program.   Duration: Be able to do 30 to 45 minutes of continuous aerobic exercise in addition to a 5 minute warm-up and a 5 minute cool-down routine.   Nutrition Goals: Your personal nutrition goals will be established when you do your nutrition analysis with the dietician.  The following are general nutrition guidelines to follow: Cholesterol < 200mg /day Sodium < 1500mg /day Fiber: Women over 50 yrs - 21 grams per day  Personal Goals:  Personal Goals and  Risk Factors at Admission - 01/24/24 0835       Core Components/Risk Factors/Patient Goals on Admission    Weight Management Weight Maintenance    Diabetes Yes    Intervention Provide education about signs/symptoms and action to take for hypo/hyperglycemia.;Provide education about proper nutrition, including hydration, and aerobic/resistive exercise prescription along with prescribed medications to achieve blood glucose in normal ranges: Fasting glucose 65-99 mg/dL    Expected Outcomes Short Term: Participant verbalizes understanding of the signs/symptoms and immediate care of hyper/hypoglycemia, proper foot care and importance of medication, aerobic/resistive exercise and nutrition plan for blood glucose control.;Long Term: Attainment of HbA1C < 7%.    Heart Failure Yes    Intervention Provide a combined exercise and nutrition program that is supplemented with education, support and counseling about heart failure. Directed toward relieving symptoms such as shortness of breath, decreased exercise tolerance, and extremity edema.    Expected Outcomes Improve functional capacity of life;Short term: Attendance in program 2-3 days a week with increased exercise capacity. Reported lower sodium intake. Reported increased fruit and vegetable intake. Reports medication compliance.;Short term: Daily weights obtained and reported for increase. Utilizing diuretic protocols set by physician.;Long term: Adoption of self-care skills and reduction of barriers for early signs and symptoms recognition and intervention  leading to self-care maintenance.    Hypertension Yes    Intervention Provide education on lifestyle modifcations including regular physical activity/exercise, weight management, moderate sodium restriction and increased consumption of fresh fruit, vegetables, and low fat dairy, alcohol moderation, and smoking cessation.;Monitor prescription use compliance.    Expected Outcomes Short Term: Continued  assessment and intervention until BP is < 140/28mm HG in hypertensive participants. < 130/27mm HG in hypertensive participants with diabetes, heart failure or chronic kidney disease.;Long Term: Maintenance of blood pressure at goal levels.    Lipids Yes    Intervention Provide education and support for participant on nutrition & aerobic/resistive exercise along with prescribed medications to achieve LDL 70mg , HDL >40mg .    Expected Outcomes Short Term: Participant states understanding of desired cholesterol values and is compliant with medications prescribed. Participant is following exercise prescription and nutrition guidelines.;Long Term: Cholesterol controlled with medications as prescribed, with individualized exercise RX and with personalized nutrition plan. Value goals: LDL < 70mg , HDL > 40 mg.          Exercise Goals and Review:  Exercise Goals     Row Name 01/24/24 0845             Exercise Goals   Increase Physical Activity Yes       Intervention Provide advice, education, support and counseling about physical activity/exercise needs.;Develop an individualized exercise prescription for aerobic and resistive training based on initial evaluation findings, risk stratification, comorbidities and participant's personal goals.       Expected Outcomes Short Term: Attend rehab on a regular basis to increase amount of physical activity.;Long Term: Add in home exercise to make exercise part of routine and to increase amount of physical activity.;Long Term: Exercising regularly at least 3-5 days a week.       Increase Strength and Stamina Yes       Intervention Provide advice, education, support and counseling about physical activity/exercise needs.;Develop an individualized exercise prescription for aerobic and resistive training based on initial evaluation findings, risk stratification, comorbidities and participant's personal goals.       Expected Outcomes Short Term: Increase workloads  from initial exercise prescription for resistance, speed, and METs.;Short Term: Perform resistance training exercises routinely during rehab and add in resistance training at home;Long Term: Improve cardiorespiratory fitness, muscular endurance and strength as measured by increased METs and functional capacity ( )       Able to understand and use rate of perceived exertion (RPE) scale Yes       Intervention Provide education and explanation on how to use RPE scale       Expected Outcomes Short Term: Able to use RPE daily in rehab to express subjective intensity level;Long Term:  Able to use RPE to guide intensity level when exercising independently       Able to understand and use Dyspnea scale Yes       Intervention Provide education and explanation on how to use Dyspnea scale       Expected Outcomes Short Term: Able to use Dyspnea scale daily in rehab to express subjective sense of shortness of breath during exertion;Long Term: Able to use Dyspnea scale to guide intensity level when exercising independently       Knowledge and understanding of Target Heart Rate Range (THRR) Yes       Intervention Provide education and explanation of THRR including how the numbers were predicted and where they are located for reference       Expected Outcomes Short Term:  Able to state/look up THRR;Long Term: Able to use THRR to govern intensity when exercising independently;Short Term: Able to use daily as guideline for intensity in rehab       Able to check pulse independently Yes       Intervention Provide education and demonstration on how to check pulse in carotid and radial arteries.;Review the importance of being able to check your own pulse for safety during independent exercise       Expected Outcomes Short Term: Able to explain why pulse checking is important during independent exercise;Long Term: Able to check pulse independently and accurately       Understanding of Exercise Prescription Yes        Intervention Provide education, explanation, and written materials on patient's individual exercise prescription       Expected Outcomes Short Term: Able to explain program exercise prescription;Long Term: Able to explain home exercise prescription to exercise independently          Copy of goals given to participant.

## 2024-02-02 NOTE — Progress Notes (Signed)
 Cardiac Individual Treatment Plan  Patient Details  Name: Laurie Lamb MRN: [id number] Date of Birth: 1951/02/07 Referring Provider:   Flowsheet Row CARDIAC REHAB PHASE II ORIENTATION from 02/02/2024 in [place] CARDIAC REHABILITATION  Referring Provider Verlin Key MD    Initial Encounter Date:  Flowsheet Row CARDIAC REHAB PHASE II ORIENTATION from 02/02/2024 in [place] CARDIAC REH[facility]ILITATION  Date 02/02/24    Visit Diagnosis: ST elevation myocardial infarction involving left anterior descending (LAD) coronary artery [facility])  Status post coronary artery stent placement  Patient's Home Medications on Admission:  Current Outpatient Medications:    acetaminophen  (TYLENOL ) 500 MG tablet, Take 500 mg by mouth daily as needed for mild pain (pain score 1-3)., Disp: , Rfl:    aspirin  EC 81 MG tablet, Take 81 mg by mouth daily. Swallow whole., Disp: , Rfl:    calcium carbonate (OSCAL) 1500 (600 Ca) MG TABS tablet, Take 600 mg of elemental calcium by mouth 2 (two) times daily with a meal. (Patient taking differently: Take 600 mg of elemental calcium by mouth 2 (two) times daily with a meal.), Disp: , Rfl:    dapagliflozin  propanediol (FARXIGA ) 10 MG TABS tablet, Take 1 tablet (10 mg total) by mouth daily before breakfast., Disp: 90 tablet, Rfl: 3   Evolocumab  (REPATHA  SURECLICK) 140 MG/ML SOAJ, Inject 140 mg into the skin every 14 (fourteen) days., Disp: 6 mL, Rfl: 3   glimepiride (AMARYL) 1 MG tablet, Take 1 mg by mouth daily with breakfast., Disp: , Rfl:    losartan  (COZAAR ) 25 MG tablet, Take 1 tablet (25 mg total) by mouth daily., Disp: 90 tablet, Rfl: 3   metFORMIN (GLUCOPHAGE) 500 MG tablet, Take by mouth 2 (two) times daily with a meal., Disp: , Rfl:    metoprolol  succinate (TOPROL -XL) 25 MG 24 hr tablet, Take 0.5 tablets (12.5 mg total) by mouth daily., Disp: 30 tablet, Rfl: 3   omega-3 acid ethyl esters (LOVAZA) 1 g capsule, Take 1 g by mouth 2 (two) times daily. (Patient  taking differently: Take 1 g by mouth 2 (two) times daily.), Disp: , Rfl:    spironolactone  (ALDACTONE ) 25 MG tablet, Take 1 tablet (25 mg total) by mouth daily., Disp: 30 tablet, Rfl: 11   ticagrelor  (BRILINTA ) 90 MG TABS tablet, Take 1 tablet (90 mg total) by mouth 2 (two) times daily., Disp: 60 tablet, Rfl: 11  Past Medical History: Past Medical History:  Diagnosis Date   Arthritis    Diabetes mellitus without complication (HCC)    Hypertension     Tobacco Use: Social History   Tobacco Use  Smoking Status Never  Smokeless Tobacco Never    Labs: Review Flowsheet       Latest Ref Rng & Units 01/06/2024  Labs for ITP Cardiac and Pulmonary Rehab  Cholestrol 0 - 200 mg/dL 820   LDL (calc) 0 - 99 mg/dL 897   HDL-C >59 mg/dL 37   Trlycerides <849 mg/dL 798   Hemoglobin J8r 4.8 - 5.6 % 6.7   TCO2 22 - 32 mmol/L 20      Exercise Target Goals: Exercise Program Goal: Individual exercise prescription set using results from initial 6 min walk test and THRR while considering  patient's activity barriers and safety.   Exercise Prescription Goal: Initial exercise prescription builds to 30-45 minutes a day of aerobic activity, 2-3 days per week.  Home exercise guidelines will be given to patient during program as part of exercise prescription that the participant will acknowledge.  Education: Aerobic Exercise: - Group verbal and visual presentation on the components of exercise prescription. Introduces F.I.T.T principle from ACSM for exercise prescriptions.  Reviews F.I.T.T. principles of aerobic exercise including progression. Written material provided at class time.   Education: Resistance Exercise: - Group verbal and visual presentation on the components of exercise prescription. Introduces F.I.T.T principle from ACSM for exercise prescriptions  Reviews F.I.T.T. principles of resistance exercise including progression. Written material provided at class time.    Education:  Exercise & Equipment Safety: - Individual verbal instruction and demonstration of equipment use and safety with use of the equipment.   Education: Exercise Physiology & General Exercise Guidelines: - Group verbal and written instruction with models to review the exercise physiology of the cardiovascular system and associated critical values. Provides general exercise guidelines with specific guidelines to those with heart or lung disease. Written material provided at class time. Flowsheet Row CARDIAC REHAB PHASE II ORIENTATION from 02/02/2024 in [place] [facility]  Education need identified 02/02/24    Education: Flexibility, Balance, Mind/Body Relaxation: - Group verbal and visual presentation with interactive activity on the components of exercise prescription. Introduces F.I.T.T principle from ACSM for exercise prescriptions. Reviews F.I.T.T. principles of flexibility and balance exercise training including progression. Also discusses the mind body connection.  Reviews various relaxation techniques to help reduce and manage stress (i.e. Deep breathing, progressive muscle relaxation, and visualization). Balance handout provided to take home. Written material provided at class time.   Activity Barriers & Risk Stratification:  Activity Barriers & Cardiac Risk Stratification - 01/24/24 0845       Activity Barriers & Cardiac Risk Stratification   Activity Barriers Arthritis    Cardiac Risk Stratification Moderate          6 Minute Walk:  6 Minute Walk     Row Name 02/02/24 0934         6 Minute Walk   Phase Initial     Distance 1200 feet     Walk Time 6 minutes     # of Rest Breaks 0     MPH 2.27     METS 2.59     RPE 11     VO2 Peak 9.07     Symptoms No     Resting HR 66 bpm     Resting BP 130/60     Resting Oxygen Saturation  95 %     Exercise Oxygen Saturation  during 6 min walk 96 %     Max Ex. HR 103 bpm     Max Ex. BP 126/60     2 Minute Post BP  118/60        Oxygen Initial Assessment:   Oxygen Re-Evaluation:   Oxygen Discharge (Final Oxygen Re-Evaluation):   Initial Exercise Prescription:  Initial Exercise Prescription - 02/02/24 0900       Date of Initial Exercise RX and Referring Provider   Date 02/02/24    Referring Provider Verlin Key MD      Treadmill   MPH 1.4    Grade 0    Minutes 15    METs 1.8      NuStep   Level 2    SPM 50    Minutes 15    METs 2      Prescription Details   Frequency (times per week) 2    Duration Progress to 30 minutes of continuous aerobic without signs/symptoms of physical distress      Intensity   THRR  40-80% of Max Heartrate 99-132    Ratings of Perceived Exertion 11-13    Perceived Dyspnea 0-4      Resistance Training   Training Prescription Yes    Weight 4    Reps 10-15          Perform Capillary Blood Glucose checks as needed.  Exercise Prescription Changes:   Exercise Prescription Changes     Row Name 02/02/24 0900             Response to Exercise   Blood Pressure (Admit) 130/60       Blood Pressure (Exercise) 126/60       Blood Pressure (Exit) 118/60       Heart Rate (Admit) 66 bpm       Heart Rate (Exercise) 103 bpm       Heart Rate (Exit) 78 bpm       Oxygen Saturation (Admit) 95 %       Oxygen Saturation (Exercise) 96 %       Oxygen Saturation (Exit) 96 %       Rating of Perceived Exertion (Exercise) 11       Perceived Dyspnea (Exercise) 0          Exercise Comments:   Exercise Goals and Review:   Exercise Goals     Row Name 01/24/24 0845             Exercise Goals   Increase Physical Activity Yes       Intervention Provide advice, education, support and counseling about physical activity/exercise needs.;Develop an individualized exercise prescription for aerobic and resistive training based on initial evaluation findings, risk stratification, comorbidities and participant's personal goals.       Expected  Outcomes Short Term: Attend rehab on a regular basis to increase amount of physical activity.;Long Term: Add in home exercise to make exercise part of routine and to increase amount of physical activity.;Long Term: Exercising regularly at least 3-5 days a week.       Increase Strength and Stamina Yes       Intervention Provide advice, education, support and counseling about physical activity/exercise needs.;Develop an individualized exercise prescription for aerobic and resistive training based on initial evaluation findings, risk stratification, comorbidities and participant's personal goals.       Expected Outcomes Short Term: Increase workloads from initial exercise prescription for resistance, speed, and METs.;Short Term: Perform resistance training exercises routinely during rehab and add in resistance training at home;Long Term: Improve cardiorespiratory fitness, muscular endurance and strength as measured by increased METs and functional capacity ( )       Able to understand and use rate of perceived exertion (RPE) scale Yes       Intervention Provide education and explanation on how to use RPE scale       Expected Outcomes Short Term: Able to use RPE daily in rehab to express subjective intensity level;Long Term:  Able to use RPE to guide intensity level when exercising independently       Able to understand and use Dyspnea scale Yes       Intervention Provide education and explanation on how to use Dyspnea scale       Expected Outcomes Short Term: Able to use Dyspnea scale daily in rehab to express subjective sense of shortness of breath during exertion;Long Term: Able to use Dyspnea scale to guide intensity level when exercising independently       Knowledge and understanding of Target Heart Rate Range (THRR) Yes  Intervention Provide education and explanation of THRR including how the numbers were predicted and where they are located for reference       Expected Outcomes Short Term:  Able to state/look up THRR;Long Term: Able to use THRR to govern intensity when exercising independently;Short Term: Able to use daily as guideline for intensity in rehab       Able to check pulse independently Yes       Intervention Provide education and demonstration on how to check pulse in carotid and radial arteries.;Review the importance of being able to check your own pulse for safety during independent exercise       Expected Outcomes Short Term: Able to explain why pulse checking is important during independent exercise;Long Term: Able to check pulse independently and accurately       Understanding of Exercise Prescription Yes       Intervention Provide education, explanation, and written materials on patient's individual exercise prescription       Expected Outcomes Short Term: Able to explain program exercise prescription;Long Term: Able to explain home exercise prescription to exercise independently          Exercise Goals Re-Evaluation :   Discharge Exercise Prescription (Final Exercise Prescription Changes):  Exercise Prescription Changes - 02/02/24 0900       Response to Exercise   Blood Pressure (Admit) 130/60    Blood Pressure (Exercise) 126/60    Blood Pressure (Exit) 118/60    Heart Rate (Admit) 66 bpm    Heart Rate (Exercise) 103 bpm    Heart Rate (Exit) 78 bpm    Oxygen Saturation (Admit) 95 %    Oxygen Saturation (Exercise) 96 %    Oxygen Saturation (Exit) 96 %    Rating of Perceived Exertion (Exercise) 11    Perceived Dyspnea (Exercise) 0          Nutrition:  Target Goals: Understanding of nutrition guidelines, daily intake of sodium 1500mg , cholesterol 200mg , calories 30% from fat and 7% or less from saturated fats, daily to have 5 or more servings of fruits and vegetables.  Education: Nutrition 1 -Group instruction provided by verbal, written material, interactive activities, discussions, models, and posters to present general guidelines for heart  healthy nutrition including macronutrients, label reading, and promoting whole foods over processed counterparts. Education serves as pensions consultant of discussion of heart healthy eating for all. Written material provided at class time.    Education: Nutrition 2 -Group instruction provided by verbal, written material, interactive activities, discussions, models, and posters to present general guidelines for heart healthy nutrition including sodium, cholesterol, and saturated fat. Providing guidance of habit forming to improve blood pressure, cholesterol, and body weight. Written material provided at class time.     Biometrics:  Pre Biometrics - 02/02/24 0937       Pre Biometrics   Height 5' (1.524 m)    Weight 57.6 kg    Waist Circumference 34 inches    Hip Circumference 35.5 inches    Waist to Hip Ratio 0.96 %    BMI (Calculated) 24.8    Grip Strength 18 kg    Single Leg Stand 30 seconds           Nutrition Therapy Plan and Nutrition Goals:   Nutrition Assessments:  MEDIFICTS Score Key: >=70 Need to make dietary changes  40-70 Heart Healthy Diet <= 40 Therapeutic Level Cholesterol Diet  Flowsheet Row CARDIAC REHAB PHASE II ORIENTATION from 02/02/2024 in [facility] CARDIAC REH[facility]ILITATION  Picture Your Plate  Total Score on Admission 55   Picture Your Plate Scores: <59 Unhealthy dietary pattern with much room for improvement. 41-50 Dietary pattern unlikely to meet recommendations for good health and room for improvement. 51-60 More healthful dietary pattern, with some room for improvement.  >60 Healthy dietary pattern, although there may be some specific behaviors that could be improved.    Nutrition Goals Re-Evaluation:   Nutrition Goals Discharge (Final Nutrition Goals Re-Evaluation):   Psychosocial: Target Goals: Acknowledge presence or absence of significant depression and/or stress, maximize coping skills, provide positive support system. Participant is able to  verbalize types and ability to use techniques and skills needed for reducing stress and depression.   Education: Stress, Anxiety, and Depression - Group verbal and visual presentation to define topics covered.  Reviews how body is impacted by stress, anxiety, and depression.  Also discusses healthy ways to reduce stress and to treat/manage anxiety and depression. Written material provided at class time. Flowsheet Row CARDIAC REHAB PHASE II ORIENTATION from 02/02/2024 in [place] CARDIAC RE[facility]ITATION  Education need identified 02/02/24    Education: Sleep Hygiene -Provides group verbal and written instruction about how sleep can affect your health.  Define sleep hygiene, discuss sleep cycles and impact of sleep habits. Review good sleep hygiene tips.   Initial Review & Psychosocial Screening:  Initial Psych Review & Screening - 01/24/24 0824       Initial Review   Current issues with None Identified      Family Dynamics   Good Support System? Yes    Comments Spouse and son (29 years old) live in home.      Barriers   Psychosocial barriers to participate in program There are no identifiable barriers or psychosocial needs.      Screening Interventions   Interventions Encouraged to exercise    Expected Outcomes Short Term goal: Utilizing psychosocial counselor, staff and physician to assist with identification of specific Stressors or current issues interfering with healing process. Setting desired goal for each stressor or current issue identified.;Long Term Goal: Stressors or current issues are controlled or eliminated.;Short Term goal: Identification and review with participant of any Quality of Life or Depression concerns found by scoring the questionnaire.;Long Term goal: The participant improves quality of Life and PHQ9 Scores as seen by post scores and/or verbalization of changes          Quality of Life Scores:   Scores of 19 and below usually indicate a poorer quality of  life in these areas.  A difference of  2-3 points is a clinically meaningful difference.  A difference of 2-3 points in the total score of the Quality of Life Index has been associated with significant improvement in overall quality of life, self-image, physical symptoms, and general health in studies assessing change in quality of life.  PHQ-9: Review Flowsheet       02/02/2024  Depression screen PHQ 2/9  Decreased Interest 0  Down, Depressed, Hopeless 0  PHQ - 2 Score 0  Altered sleeping 1  Tired, decreased energy 1  Change in appetite 1  Feeling bad or failure about yourself  0  Trouble concentrating 0  Moving slowly or fidgety/restless 0  Suicidal thoughts 0  PHQ-9 Score 3  Difficult doing work/chores Not difficult at all   Interpretation of Total Score  Total Score Depression Severity:  1-4 = Minimal depression, 5-9 = Mild depression, 10-14 = Moderate depression, 15-19 = Moderately severe depression, 20-27 = Severe depression   Psychosocial Evaluation  and Intervention:  Psychosocial Evaluation - 02/02/24 0953       Psychosocial Evaluation & Interventions   Interventions Stress management education;Relaxation education;Encouraged to exercise with the program and follow exercise prescription    Comments Patient's PHQ-9 score was 3. She denies any depression or anxiety. She says she does not sleep well which has been a chronic issues for her. She does not use any sleep aides. She says she only wants to do 6 weeks which would be 12 sessions. 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    Expected Outcomes Short Term: Patient will start the program and attend consistently. Long Term: Patient will complete the program meeting personal goals.    Continue Psychosocial Services  Follow up required by staff          Psychosocial Re-Evaluation:   Psychosocial Discharge (Final Psychosocial Re-Evaluation):   Vocational Rehabilitation: Provide vocational rehab assistance to  qualifying candidates.   Vocational Rehab Evaluation & Intervention:  Vocational Rehab - 01/24/24 0854       Initial Vocational Rehab Evaluation & Intervention   Assessment shows need for Vocational Rehabilitation No          Education: Education Goals: Education classes will be provided on a variety of topics geared toward better understanding of heart health and risk factor modification. Participant will state understanding/return demonstration of topics presented as noted by education test scores.  Learning Barriers/Preferences:  Learning Barriers/Preferences - 01/24/24 9166       Learning Barriers/Preferences   Learning Barriers None    Learning Preferences Group Instruction;Individual Instruction;Skilled Demonstration;Verbal Instruction          General Cardiac Education Topics:  AED/CPR: - Group verbal and written instruction with the use of models to demonstrate the basic use of the AED with the basic ABC's of resuscitation.   Test and Procedures: - Group verbal and visual presentation and models provide information about basic cardiac anatomy and function. Reviews the testing methods done to diagnose heart disease and the outcomes of the test results. Describes the treatment choices: Medical Management, Angioplasty, or Coronary Bypass Surgery for treating various heart conditions including Myocardial Infarction, Angina, Valve Disease, and Cardiac Arrhythmias. Written material provided at class time.   Medication Safety: - Group verbal and visual instruction to review commonly prescribed medications for heart and lung disease. Reviews the medication, class of the drug, and side effects. Includes the steps to properly store meds and maintain the prescription regimen. Written material provided at class time.   Intimacy: - Group verbal instruction through game format to discuss how heart and lung disease can affect sexual intimacy. Written material provided at class  time.   Know Your Numbers and Heart Failure: - Group verbal and visual instruction to discuss disease risk factors for cardiac and pulmonary disease and treatment options.  Reviews associated critical values for Overweight/Obesity, Hypertension, Cholesterol, and Diabetes.  Discusses basics of heart failure: signs/symptoms and treatments.  Introduces Heart Failure Zone chart for action plan for heart failure. Written material provided at class time.   Infection Prevention: - Provides verbal and written material to individual with discussion of infection control including proper hand washing and proper equipment cleaning during exercise session.   Falls Prevention: - Provides verbal and written material to individual with discussion of falls prevention and safety.   Other: -Provides group and verbal instruction on various topics (see comments)   Knowledge Questionnaire Score:  Knowledge Questionnaire Score - 02/02/24 9047  Knowledge Questionnaire Score   Pre Score 22/26          Core Components/Risk Factors/Patient Goals at Admission:  Personal Goals and Risk Factors at Admission - 01/24/24 0835       Core Components/Risk Factors/Patient Goals on Admission    Weight Management Weight Maintenance    Diabetes Yes    Intervention Provide education about signs/symptoms and action to take for hypo/hyperglycemia.;Provide education about proper nutrition, including hydration, and aerobic/resistive exercise prescription along with prescribed medications to achieve blood glucose in normal ranges: Fasting glucose 65-99 mg/dL    Expected Outcomes Short Term: Participant verbalizes understanding of the signs/symptoms and immediate care of hyper/hypoglycemia, proper foot care and importance of medication, aerobic/resistive exercise and nutrition plan for blood glucose control.;Long Term: Attainment of HbA1C < 7%.    Heart Failure Yes    Intervention Provide a combined exercise and  nutrition program that is supplemented with education, support and counseling about heart failure. Directed toward relieving symptoms such as shortness of breath, decreased exercise tolerance, and extremity edema.    Expected Outcomes Improve functional capacity of life;Short term: Attendance in program 2-3 days a week with increased exercise capacity. Reported lower sodium intake. Reported increased fruit and vegetable intake. Reports medication compliance.;Short term: Daily weights obtained and reported for increase. Utilizing diuretic protocols set by physician.;Long term: Adoption of self-care skills and reduction of barriers for early signs and symptoms recognition and intervention leading to self-care maintenance.    Hypertension Yes    Intervention Provide education on lifestyle modifcations including regular physical activity/exercise, weight management, moderate sodium restriction and increased consumption of fresh fruit, vegetables, and low fat dairy, alcohol moderation, and smoking cessation.;Monitor prescription use compliance.    Expected Outcomes Short Term: Continued assessment and intervention until BP is < 140/100mm HG in hypertensive participants. < 130/24mm HG in hypertensive participants with diabetes, heart failure or chronic kidney disease.;Long Term: Maintenance of blood pressure at goal levels.    Lipids Yes    Intervention Provide education and support for participant on nutrition & aerobic/resistive exercise along with prescribed medications to achieve LDL 70mg , HDL >40mg .    Expected Outcomes Short Term: Participant states understanding of desired cholesterol values and is compliant with medications prescribed. Participant is following exercise prescription and nutrition guidelines.;Long Term: Cholesterol controlled with medications as prescribed, with individualized exercise RX and with personalized nutrition plan. Value goals: LDL < 70mg , HDL > 40 mg.           Education:Diabetes - Individual verbal and written instruction to review signs/symptoms of diabetes, desired ranges of glucose level fasting, after meals and with exercise. Acknowledge that pre and post exercise glucose checks will be done for 3 sessions at entry of program.   Core Components/Risk Factors/Patient Goals Review:    Core Components/Risk Factors/Patient Goals at Discharge (Final Review):    ITP Comments:  ITP Comments     Row Name 01/24/24 0810           ITP Comments Completed virtual orientation today.  EP evaluation is scheduled for 02/02/24 at 0830 .  Documentation for diagnosis can be found in [facility] encounter 01/06/24.          Comments: Patient arrived for 1st visit/orientation/education at 0830. Patient was referred to CR by Dr. Lonni Cash due to STEMI/Stent placement. During orientation advised patient on arrival and appointment times what to wear, what to do before, during and after exercise. Reviewed attendance and class policy.  Pt is scheduled to  return Cardiac Rehab on 02/06/24 at 915. Pt was advised to come to class 15 minutes before class starts.  Discussed RPE/Dpysnea scales. Patient participated in warm up stretches. Patient was able to complete 6 minute walk test.  Telemetry:NSR. Patient was measured for the equipment. Discussed equipment safety with patient. Took patient pre-anthropometric measurements. Patient finished visit at 930.

## 2024-02-06 ENCOUNTER — Other Ambulatory Visit (HOSPITAL_COMMUNITY): Payer: Self-pay

## 2024-02-06 ENCOUNTER — Telehealth: Payer: Self-pay | Admitting: Pharmacy Technician

## 2024-02-06 ENCOUNTER — Telehealth: Payer: Self-pay | Admitting: Physician Assistant

## 2024-02-06 ENCOUNTER — Encounter (HOSPITAL_COMMUNITY): Admission: RE | Admit: 2024-02-06 | Discharge: 2024-02-06 | Attending: Cardiovascular Disease

## 2024-02-06 DIAGNOSIS — I2102 ST elevation (STEMI) myocardial infarction involving left anterior descending coronary artery: Secondary | ICD-10-CM | POA: Diagnosis not present

## 2024-02-06 DIAGNOSIS — Z955 Presence of coronary angioplasty implant and graft: Secondary | ICD-10-CM

## 2024-02-06 LAB — GLUCOSE, CAPILLARY
Glucose-Capillary: 165 mg/dL — ABNORMAL HIGH (ref 70–99)
Glucose-Capillary: 90 mg/dL (ref 70–99)

## 2024-02-06 NOTE — Telephone Encounter (Signed)
 Called patient and patient reported starting Farxiga  10mg  daily. Patient verbalized poor appetite and reports since last Thursday she has lost a pound and total of 8lbs since starting taking Farxiga  11/20. Patient would like advice and recommendations. Per patient she will continue to take Farxiga  10mg  until advised by cardiology. Patents reports  left arm and neck has been sore and throbbing and reports the symptoms come and go. Patient reports symptoms are better with position changes. Patient reports she noticed this soreness and throbbing three weeks ago and wants advice on whether Repatha  could contribute to her soreness in her left shoulder and neck. Patient denies lifting heavy objects and reports using good body mechanics. Patient reports injecting Repatha  in her outer thighs. Made patient aware of the side effects of Repatha . Patient would like advice. Made patient aware that this will be forward to provider for advice.

## 2024-02-06 NOTE — Telephone Encounter (Signed)
 Pt called in stating she got a month supply for $47 but for 90 days she said it was going to be $140. Is someone able to explain this to her? She asked if this price could be adjusted somehow or is this just what her insurance requires?

## 2024-02-06 NOTE — Telephone Encounter (Signed)
 I called the patient and had to leave a message     There is no assistance for brilinta .

## 2024-02-06 NOTE — Progress Notes (Signed)
 Daily Session Note  Patient Details  Name: Laurie Lamb MRN: [id number] Date of Birth: October 19, 1950 Referring Provider:   Flowsheet Row CARDIAC REHAB PHASE II ORIENTATION from 02/02/2024 in [facility] CARDIAC REHABILITATION  Referring Provider Verlin Key MD    Encounter Date: 02/06/2024  Check In:  Session Check In - 02/06/24 0920       Check-In   Supervising physician immediately available to respond to emergencies See telemetry face sheet for immediately available MD    Location [facility]    Staff Present Laymon Rattler, BSN, RN, WTA-C;Heather Con, BS, Exercise Physiologist;Jessica Vonzell, MA, RCEP, CCRP, CCET    Virtual Visit No    Medication changes reported     No    Fall or balance concerns reported    No    Tobacco Cessation No Change    Warm-up and Cool-down Performed on first and last piece of equipment    Resistance Training Performed Yes    VAD Patient? No    PAD/SET Patient? No      Pain Assessment   Currently in Pain? No/denies          Capillary Blood Glucose: Results for orders placed or performed during the hospital encounter of 02/06/24 (from the past 24 hours)  Glucose, capillary     Status: Abnormal   Collection Time: 02/06/24  9:14 AM  Result Value Ref Range   Glucose-Capillary 165 (H) 70 - 99 mg/dL      Social History   Tobacco Use  Smoking Status Never  Smokeless Tobacco Never    Goals Met:  Independence with exercise equipment Exercise tolerated well Personal goals reviewed No report of concerns or symptoms today Strength training completed today  Goals Unmet:  Not Applicable  Comments: First full day of exercise!  Patient was oriented to gym and equipment including functions, settings, policies, and procedures.  Patient's individual exercise prescription and treatment plan were reviewed.  All starting workloads were established based on the results of the 6 minute walk test done at initial  orientation visit.  The plan for exercise progression was also introduced and progression will be customized based on patient's performance and goals.

## 2024-02-06 NOTE — Telephone Encounter (Signed)
 Hi, the patient is asking if she can be changed to something else that is cheaper. Insurance said they will pay for clopidogrel and that would be free. She did pick up the brilinta  last week so she has 3 weeks of that. Thank you

## 2024-02-06 NOTE — Telephone Encounter (Signed)
 Ticagrelor  was $30.48 -ins wants brand now -47.00  Prasugrel is $36.22  Clopidogrel is free

## 2024-02-06 NOTE — Telephone Encounter (Signed)
 Pt c/o medication issue:  1. Name of Medication:   dapagliflozin  propanediol (FARXIGA ) 10 MG TABS tablet    2. How are you currently taking this medication (dosage and times per day)? As written   3. Are you having a reaction (difficulty breathing--STAT)? No   4. What is your medication issue? Pt called in stating since taking this mediation she has lost her appetite and she has lost about 8lbs. Please advise.

## 2024-02-08 ENCOUNTER — Encounter (HOSPITAL_COMMUNITY): Admission: RE | Admit: 2024-02-08 | Discharge: 2024-02-08 | Attending: Cardiovascular Disease

## 2024-02-08 DIAGNOSIS — I2102 ST elevation (STEMI) myocardial infarction involving left anterior descending coronary artery: Secondary | ICD-10-CM

## 2024-02-08 DIAGNOSIS — Z955 Presence of coronary angioplasty implant and graft: Secondary | ICD-10-CM

## 2024-02-08 LAB — GLUCOSE, CAPILLARY: Glucose-Capillary: 142 mg/dL — ABNORMAL HIGH (ref 70–99)

## 2024-02-08 NOTE — Progress Notes (Signed)
 Daily Session Note  Patient Details  Name: Laurie Lamb MRN: [id number] Date of Birth: [birth year] Referring Provider:   Flowsheet Row CARDIAC REHAB PHASE II ORIENTATION from 02/02/2024 in [facility] CARDIAC REHABILITATION  Referring Provider Verlin Key MD    Encounter Date: 02/08/2024  Check In:  Session Check In - 02/08/24 0934       Check-In   Supervising physician immediately available to respond to emergencies See telemetry face sheet for immediately available MD    Location [facility]    Staff Present Laymon Rattler, BSN, RN, WTA-C;Heather Con, BS, Exercise Physiologist;Jessica Vonzell, MA, RCEP, CCRP, CCET    Virtual Visit No    Medication changes reported     No    Fall or balance concerns reported    No    Tobacco Cessation No Change    Warm-up and Cool-down Performed on first and last piece of equipment    Resistance Training Performed Yes    VAD Patient? No    PAD/SET Patient? No      Pain Assessment   Currently in Pain? No/denies          Capillary Blood Glucose: Results for orders placed or performed during the hospital encounter of 02/08/24 (from the past 24 hours)  Glucose, capillary     Status: Abnormal   Collection Time: 02/08/24  9:31 AM  Result Value Ref Range   Glucose-Capillary 142 (H) 70 - 99 mg/dL      Social History   Tobacco Use  Smoking Status Never  Smokeless Tobacco Never    Goals Met:  Independence with exercise equipment Exercise tolerated well No report of concerns or symptoms today Strength training completed today  Goals Unmet:  Not Applicable  Comments: Pt able to follow exercise prescription today without complaint.  Will continue to monitor for progression.

## 2024-02-10 ENCOUNTER — Telehealth: Payer: Self-pay | Admitting: Physician Assistant

## 2024-02-10 NOTE — Telephone Encounter (Signed)
 Patient states she has throbbing, aching pain to her left shoulder and top of left arm and up into her neck. She states is has been present for 3 weeks, coming and going. No worse today than when it started. She states if she can get herself in a certain seated position it will go away. Worse when doing heavy chores.   Patient states she believes this is being caused by her Repatha  injection. She states the left arm is where she gets her injection. Denies any redness/swelling/warmth or drainage at injection site. She also states this pain feels very similar to when she was on statin medications.  Patient would like to know if she can come off of Repatha  and if so, what alternatives are there.  Patient also reports she has not had much appetite since starting Farxiga  and has lost 8-10 lbs since being on this medication.  Patient denies any chest pain/pressure, nausea, vomiting sweating or SOB. Reviewed ED precautions with her, patient verbalized understanding.  Will forward to Hao Meng, PA-C to review.

## 2024-02-10 NOTE — Telephone Encounter (Signed)
 Pt c/o of Chest Pain: STAT if active (IN THIS MOMENT) CP, including tightness, pressure, jaw pain, shoulder/upper arm/back pain, SOB, nausea, and vomiting.  1. Are you having CP right now (tightness, pressure, or discomfort)?  Yes, in back of left shoulder and left arm, radiating up neck   2. Are you experiencing any other symptoms (ex. SOB, nausea, vomiting, sweating)?  No, but patient mentions low appetite and she thinks it is due to the Farxiga .  3. How long have you been experiencing CP?  About 3 weeks  4. Is your CP continuous or coming and going?  Coming and going  5. Have you taken Nitroglycerin ?  No

## 2024-02-13 ENCOUNTER — Encounter (HOSPITAL_COMMUNITY): Admission: RE | Admit: 2024-02-13 | Discharge: 2024-02-13 | Attending: Cardiovascular Disease

## 2024-02-13 DIAGNOSIS — Z955 Presence of coronary angioplasty implant and graft: Secondary | ICD-10-CM

## 2024-02-13 DIAGNOSIS — I2102 ST elevation (STEMI) myocardial infarction involving left anterior descending coronary artery: Secondary | ICD-10-CM | POA: Diagnosis not present

## 2024-02-13 LAB — GLUCOSE, CAPILLARY
Glucose-Capillary: 133 mg/dL — ABNORMAL HIGH (ref 70–99)
Glucose-Capillary: 110 mg/dL — ABNORMAL HIGH (ref 70–99)

## 2024-02-13 NOTE — Progress Notes (Signed)
 Daily Session Note  Patient Details  Name: Laurie Lamb MRN: [id number] Date of Birth: 12-26-1950 Referring Provider:   Flowsheet Row CARDIAC REHAB PHASE II ORIENTATION from 02/02/2024 in [facility] CARDIAC REHABILITATION  Referring Provider Verlin Key MD    Encounter Date: 02/13/2024  Check In:  Session Check In - 02/13/24 0904       Check-In   Supervising physician immediately available to respond to emergencies See telemetry face sheet for immediately available MD    Location [facility]    Staff Present Laymon Rattler, BSN, RN, WTA-C;Heather Con, BS, Exercise Physiologist    Virtual Visit No    Medication changes reported     No    Fall or balance concerns reported    No    Tobacco Cessation No Change    Warm-up and Cool-down Performed on first and last piece of equipment    Resistance Training Performed Yes    VAD Patient? No    PAD/SET Patient? No      Pain Assessment   Currently in Pain? No/denies          Capillary Blood Glucose: No results found for this or any previous visit (from the past 24 hours).    Tobacco Use History[1]  Goals Met:  Independence with exercise equipment Exercise tolerated well No report of concerns or symptoms today Strength training completed today  Goals Unmet:  Not Applicable  Comments: Pt able to follow exercise prescription today without complaint.  Will continue to monitor for progression.        [1]  Social History Tobacco Use  Smoking Status Never  Smokeless Tobacco Never

## 2024-02-14 NOTE — Telephone Encounter (Signed)
 I spoke with the patient, Brilinta  cost $47, however she is able to get it for now.  I will would prefer she stay on the Brilinta  unless the price increase any further.  She is aware that Plavix is a cheaper alternative, however in clinical trials, Brilinta  is a superior drug.  She is willing to stay on the Brilinta  for now.

## 2024-02-14 NOTE — Telephone Encounter (Signed)
 Reviewed message from Hao with patient, she verbalized understanding of recommendations. She will follow-up with Hao at appt on 03/08/24.

## 2024-02-14 NOTE — Telephone Encounter (Signed)
 I spoke with the patient, suspicion for Repatha  being the cause of her shoulder and arm pain is fairly low, symptom is different from her anginal symptom therefore suspicion for cardiac causes also fairly low.  I asked her to hold a single dose of Repatha  and resume 2 weeks later to see if her symptoms improve during this period.  If coming off the Repatha  for 2 weeks made no difference to her symptoms, then it is likely musculoskeletal pain that she experienced during cardiac rehab.  As for the Farxiga , switching to alternatives such as Jardiance likely will have the same cost.  For now Farxiga  cost about $70, however if cost become a issue, she is okay to come off of Farxiga .  Garima Chronis

## 2024-02-15 ENCOUNTER — Encounter (HOSPITAL_COMMUNITY): Admission: RE | Admit: 2024-02-15 | Discharge: 2024-02-15 | Attending: Cardiovascular Disease

## 2024-02-15 DIAGNOSIS — Z955 Presence of coronary angioplasty implant and graft: Secondary | ICD-10-CM

## 2024-02-15 DIAGNOSIS — I2102 ST elevation (STEMI) myocardial infarction involving left anterior descending coronary artery: Secondary | ICD-10-CM

## 2024-02-15 NOTE — Progress Notes (Signed)
 Daily Session Note  Patient Details  Name: Laurie Lamb MRN: [id number] Date of Birth: [birth year] Referring Provider:   Flowsheet Row CARDIAC REHAB PHASE II ORIENTATION from 02/02/2024 in [facility] CARDIAC REHABILITATION  Referring Provider Verlin Key MD    Encounter Date: 02/15/2024  Check In:  Session Check In - 02/15/24 0919       Check-In   Supervising physician immediately available to respond to emergencies See telemetry face sheet for immediately available MD    Location [facility]    Staff Present Laymon Rattler, BSN, RN, Rosalba Gelineau, MA, RCEP, CCRP, CCET;Alexiah Koroma [employer], RN    Virtual Visit No    Medication changes reported     No    Fall or balance concerns reported    No    Tobacco Cessation No Change    Warm-up and Cool-down Performed on first and last piece of equipment    Resistance Training Performed Yes    VAD Patient? No    PAD/SET Patient? No      Pain Assessment   Currently in Pain? No/denies    Multiple Pain Sites No          Capillary Blood Glucose: No results found for this or any previous visit (from the past 24 hours).    Tobacco Use History[1]  Goals Met:  Independence with exercise equipment Exercise tolerated well No report of concerns or symptoms today Strength training completed today  Goals Unmet:  Not Applicable  Comments: SABRASABRAPt able to follow exercise prescription today without complaint.  Will continue to monitor for progression.        [1]  Social History Tobacco Use  Smoking Status Never  Smokeless Tobacco Never

## 2024-02-20 ENCOUNTER — Encounter (HOSPITAL_COMMUNITY)

## 2024-02-20 DIAGNOSIS — I2102 ST elevation (STEMI) myocardial infarction involving left anterior descending coronary artery: Secondary | ICD-10-CM | POA: Diagnosis not present

## 2024-02-20 DIAGNOSIS — Z955 Presence of coronary angioplasty implant and graft: Secondary | ICD-10-CM

## 2024-02-20 NOTE — Progress Notes (Signed)
 Daily Session Note  Patient Details  Name: ADENIKE SHIDLER MRN: [id number] Date of Birth: [birth year] Referring Provider:   Flowsheet Row CARDIAC REHAB PHASE II ORIENTATION from 02/02/2024 in [facility] CARDIAC REHABILITATION  Referring Provider Verlin Key MD    Encounter Date: 02/20/2024  Check In:  Session Check In - 02/20/24 0916       Check-In   Supervising physician immediately available to respond to emergencies See telemetry face sheet for immediately available MD    Location [facility]    Staff Present Powell Benders, BS, Exercise Physiologist;Brittany Jackquline, BSN, RN, Rosalba Gelineau, MA, RCEP, CCRP, CCET    Virtual Visit No    Medication changes reported     No    Fall or balance concerns reported    No    Tobacco Cessation No Change    Warm-up and Cool-down Performed on first and last piece of equipment    Resistance Training Performed Yes    VAD Patient? No      Pain Assessment   Currently in Pain? No/denies          Capillary Blood Glucose: No results found for this or any previous visit (from the past 24 hours).    Tobacco Use History[1]  Goals Met:  Independence with exercise equipment Exercise tolerated well No report of concerns or symptoms today Strength training completed today  Goals Unmet:  Not Applicable  Comments: Pt able to follow exercise prescription today without complaint.  Will continue to monitor for progression.        [1]  Social History Tobacco Use  Smoking Status Never  Smokeless Tobacco Never

## 2024-02-22 ENCOUNTER — Encounter (HOSPITAL_COMMUNITY)
Admission: RE | Admit: 2024-02-22 | Discharge: 2024-02-22 | Disposition: A | Source: Ambulatory Visit | Attending: Cardiovascular Disease

## 2024-02-22 DIAGNOSIS — I2102 ST elevation (STEMI) myocardial infarction involving left anterior descending coronary artery: Secondary | ICD-10-CM | POA: Diagnosis not present

## 2024-02-22 DIAGNOSIS — Z955 Presence of coronary angioplasty implant and graft: Secondary | ICD-10-CM

## 2024-02-22 NOTE — Progress Notes (Signed)
 Daily Session Note  Patient Details  Name: Laurie Lamb MRN: [id number] Date of Birth: 14-Aug-1950 Referring Provider:   Flowsheet Row CARDIAC REHAB PHASE II ORIENTATION from 02/02/2024 in [facility] CARDIAC REHABILITATION  Referring Provider Verlin Key MD    Encounter Date: 02/22/2024  Check In:  Session Check In - 02/22/24 0931       Check-In   Supervising physician immediately available to respond to emergencies See telemetry face sheet for immediately available MD    Location [facility]    Staff Present Powell Benders, BS, Exercise Physiologist;Jessica Vonzell, MA, RCEP, CCRP, CCET    Virtual Visit No    Medication changes reported     No    Fall or balance concerns reported    No    Tobacco Cessation Use Increase    Warm-up and Cool-down Performed on first and last piece of equipment    Resistance Training Performed Yes    VAD Patient? No    PAD/SET Patient? No      Pain Assessment   Currently in Pain? No/denies    Multiple Pain Sites No          Capillary Blood Glucose: No results found for this or any previous visit (from the past 24 hours).    Tobacco Use History[1]  Goals Met:  Independence with exercise equipment Exercise tolerated well No report of concerns or symptoms today Strength training completed today  Goals Unmet:  Not Applicable  Comments: Pt able to follow exercise prescription today without complaint.  Will continue to monitor for progression.        [1]  Social History Tobacco Use  Smoking Status Never  Smokeless Tobacco Never

## 2024-02-27 ENCOUNTER — Encounter (HOSPITAL_COMMUNITY)
Admission: RE | Admit: 2024-02-27 | Discharge: 2024-02-27 | Disposition: A | Discharge status: Home / Self Care | Source: Ambulatory Visit | Attending: Cardiovascular Disease | Admitting: Cardiovascular Disease

## 2024-02-27 DIAGNOSIS — I2102 ST elevation (STEMI) myocardial infarction involving left anterior descending coronary artery: Secondary | ICD-10-CM | POA: Diagnosis not present

## 2024-02-27 DIAGNOSIS — Z955 Presence of coronary angioplasty implant and graft: Secondary | ICD-10-CM

## 2024-02-27 NOTE — Progress Notes (Signed)
 Daily Session Note  Patient Details  Name: Laurie Lamb MRN: [id number] Date of Birth: 1951/02/05 Referring Provider:   Flowsheet Row CARDIAC REHAB PHASE II ORIENTATION from 02/02/2024 in [facility] CARDIAC REHABILITATION  Referring Provider Verlin Key MD    Encounter Date: 02/27/2024  Check In:  Session Check In - 02/27/24 0900       Check-In   Supervising physician immediately available to respond to emergencies See telemetry face sheet for immediately available MD    Location [facility]    Staff Present Harlene Gelineau, MA, RCEP, CCRP, CCET;Juanmiguel Defelice Jackquline, BSN, RN, WTA-C    Virtual Visit No    Medication changes reported     No    Fall or balance concerns reported    No    Tobacco Cessation No Change    Warm-up and Cool-down Performed on first and last piece of equipment    Resistance Training Performed Yes    VAD Patient? No    PAD/SET Patient? No      Pain Assessment   Currently in Pain? No/denies          Capillary Blood Glucose: No results found for this or any previous visit (from the past 24 hours).    Tobacco Use History[1]  Goals Met:  Independence with exercise equipment Exercise tolerated well No report of concerns or symptoms today Strength training completed today  Goals Unmet:  Not Applicable  Comments: Pt able to follow exercise prescription today without complaint.  Will continue to monitor for progression.        [1]  Social History Tobacco Use  Smoking Status Never  Smokeless Tobacco Never

## 2024-02-28 ENCOUNTER — Encounter (HOSPITAL_COMMUNITY): Payer: Self-pay | Admitting: *Deleted

## 2024-02-28 DIAGNOSIS — Z955 Presence of coronary angioplasty implant and graft: Secondary | ICD-10-CM

## 2024-02-28 DIAGNOSIS — I2102 ST elevation (STEMI) myocardial infarction involving left anterior descending coronary artery: Secondary | ICD-10-CM

## 2024-02-28 NOTE — Progress Notes (Signed)
 Cardiac Individual Treatment Plan  Patient Details  Name: Laurie Lamb MRN: [id number] Date of Birth: [birth year] Referring Provider:   Flowsheet Row CARDIAC REHAB PHASE II ORIENTATION from 02/02/2024 in [facility]  Referring Provider Verlin Key MD    Initial Encounter Date:  Flowsheet Row CARDIAC REHAB PHASE II ORIENTATION from 02/02/2024 in [facility]  Date 02/02/24    Visit Diagnosis: ST elevation myocardial infarction involving left anterior descending (LAD) coronary artery [facility])  Status post coronary artery stent placement  Patient's Home Medications on Admission: Current Medications[1]  Past Medical History: Past Medical History:  Diagnosis Date   Arthritis    Diabetes mellitus without complication (HCC)    Hypertension     Tobacco Use: Tobacco Use History[2]  Labs: Review Flowsheet       Latest Ref Rng & Units 01/06/2024  Labs for ITP Cardiac and Pulmonary Rehab  Cholestrol 0 - 200 mg/dL 820   LDL (calc) 0 - 99 mg/dL 897   HDL-C >59 mg/dL 37   Trlycerides <849 mg/dL 798   Hemoglobin J8r 4.8 - 5.6 % 6.7   TCO2 22 - 32 mmol/L 20     Capillary Blood Glucose: Lab Results  Component Value Date   GLUCAP 110 (H) 02/13/2024   GLUCAP 133 (H) 02/13/2024   GLUCAP 142 (H) 02/08/2024   GLUCAP 90 02/06/2024   GLUCAP 165 (H) 02/06/2024     Exercise Target Goals: Exercise Program Goal: Individual exercise prescription set using results from initial 6 min walk test and THRR while considering  patients activity barriers and safety.   Exercise Prescription Goal: Starting with aerobic activity 30 plus minutes a day, 3 days per week for initial exercise prescription. Provide home exercise prescription and guidelines that participant acknowledges understanding prior to discharge.  Activity Barriers & Risk Stratification:  Activity Barriers & Cardiac Risk Stratification - 01/24/24 0845       Activity  Barriers & Cardiac Risk Stratification   Activity Barriers Arthritis    Cardiac Risk Stratification Moderate          6 Minute Walk:  6 Minute Walk     Row Name 02/02/24 0934         6 Minute Walk   Phase Initial     Distance 1200 feet     Walk Time 6 minutes     # of Rest Breaks 0     MPH 2.27     METS 2.59     RPE 11     VO2 Peak 9.07     Symptoms No     Resting HR 66 bpm     Resting BP 130/60     Resting Oxygen Saturation  95 %     Exercise Oxygen Saturation  during 6 min walk 72 %     Max Ex. HR 103 bpm     Max Ex. BP 126/60     2 Minute Post BP 118/60        Oxygen Initial Assessment:   Oxygen Re-Evaluation:   Oxygen Discharge (Final Oxygen Re-Evaluation):   Initial Exercise Prescription:  Initial Exercise Prescription - 02/02/24 0900       Date of Initial Exercise RX and Referring Provider   Date 02/02/24    Referring Provider Verlin Key MD      Treadmill   MPH 1.4    Grade 0    Minutes 15    METs 1.8      NuStep  Level 2    SPM 50    Minutes 15    METs 2      Prescription Details   Frequency (times per week) 2    Duration Progress to 30 minutes of continuous aerobic without signs/symptoms of physical distress      Intensity   THRR 40-80% of Max Heartrate 99-132    Ratings of Perceived Exertion 11-13    Perceived Dyspnea 0-4      Resistance Training   Training Prescription Yes    Weight 4    Reps 10-15          Perform Capillary Blood Glucose checks as needed.  Exercise Prescription Changes:   Exercise Prescription Changes     Row Name 02/02/24 0900 02/14/24 0800           Response to Exercise   Blood Pressure (Admit) 130/60 112/60      Blood Pressure (Exercise) 126/60 126/70      Blood Pressure (Exit) 118/60 108/60      Heart Rate (Admit) 66 bpm 79 bpm      Heart Rate (Exercise) 103 bpm 116 bpm      Heart Rate (Exit) 78 bpm 88 bpm      Oxygen Saturation (Admit) 95 % --      Oxygen Saturation  (Exercise) 96 % --      Oxygen Saturation (Exit) 96 % --      Rating of Perceived Exertion (Exercise) 11 12      Perceived Dyspnea (Exercise) 0 --      Duration -- Continue with 30 min of aerobic exercise without signs/symptoms of physical distress.      Intensity -- THRR unchanged        Progression   Progression -- Continue to progress workloads to maintain intensity without signs/symptoms of physical distress.        Resistance Training   Weight -- y      Reps -- 10-15        Treadmill   MPH -- 1.2      Grade -- 0      Minutes -- 4      METs -- 1.92        NuStep   Level -- 3      SPM -- 114      Minutes -- 15      METs -- 3.1        Track   Laps -- 43      Minutes -- 12      METs -- 2.04         Exercise Comments:   Exercise Comments     Row Name 02/06/24 9078           Exercise Comments First full day of exercise!  Patient was oriented to gym and equipment including functions, settings, policies, and procedures.  Patient's individual exercise prescription and treatment plan were reviewed.  All starting workloads were established based on the results of the 6 minute walk test done at initial orientation visit.  The plan for exercise progression was also introduced and progression will be customized based on patient's performance and goals.          Exercise Goals and Review:   Exercise Goals     Row Name 01/24/24 0845             Exercise Goals   Increase Physical Activity Yes       Intervention Provide advice, education,  support and counseling about physical activity/exercise needs.;Develop an individualized exercise prescription for aerobic and resistive training based on initial evaluation findings, risk stratification, comorbidities and participant's personal goals.       Expected Outcomes Short Term: Attend rehab on a regular basis to increase amount of physical activity.;Long Term: Add in home exercise to make exercise part of routine and to  increase amount of physical activity.;Long Term: Exercising regularly at least 3-5 days a week.       Increase Strength and Stamina Yes       Intervention Provide advice, education, support and counseling about physical activity/exercise needs.;Develop an individualized exercise prescription for aerobic and resistive training based on initial evaluation findings, risk stratification, comorbidities and participant's personal goals.       Expected Outcomes Short Term: Increase workloads from initial exercise prescription for resistance, speed, and METs.;Short Term: Perform resistance training exercises routinely during rehab and add in resistance training at home;Long Term: Improve cardiorespiratory fitness, muscular endurance and strength as measured by increased METs and functional capacity ( )       Able to understand and use rate of perceived exertion (RPE) scale Yes       Intervention Provide education and explanation on how to use RPE scale       Expected Outcomes Short Term: Able to use RPE daily in rehab to express subjective intensity level;Long Term:  Able to use RPE to guide intensity level when exercising independently       Able to understand and use Dyspnea scale Yes       Intervention Provide education and explanation on how to use Dyspnea scale       Expected Outcomes Short Term: Able to use Dyspnea scale daily in rehab to express subjective sense of shortness of breath during exertion;Long Term: Able to use Dyspnea scale to guide intensity level when exercising independently       Knowledge and understanding of Target Heart Rate Range (THRR) Yes       Intervention Provide education and explanation of THRR including how the numbers were predicted and where they are located for reference       Expected Outcomes Short Term: Able to state/look up THRR;Long Term: Able to use THRR to govern intensity when exercising independently;Short Term: Able to use daily as guideline for intensity in  rehab       Able to check pulse independently Yes       Intervention Provide education and demonstration on how to check pulse in carotid and radial arteries.;Review the importance of being able to check your own pulse for safety during independent exercise       Expected Outcomes Short Term: Able to explain why pulse checking is important during independent exercise;Long Term: Able to check pulse independently and accurately       Understanding of Exercise Prescription Yes       Intervention Provide education, explanation, and written materials on patient's individual exercise prescription       Expected Outcomes Short Term: Able to explain program exercise prescription;Long Term: Able to explain home exercise prescription to exercise independently          Exercise Goals Re-Evaluation :  Exercise Goals Re-Evaluation     Row Name 02/06/24 9078 02/14/24 0831 02/15/24 0945         Exercise Goal Re-Evaluation   Exercise Goals Review Increase Physical Activity;Increase Strength and Stamina;Able to understand and use Dyspnea scale;Able to check pulse independently;Knowledge and understanding of Target Heart  Rate Range (THRR);Able to understand and use rate of perceived exertion (RPE) scale;Understanding of Exercise Prescription Increase Physical Activity;Increase Strength and Stamina;Understanding of Exercise Prescription Increase Physical Activity;Increase Strength and Stamina;Able to understand and use rate of perceived exertion (RPE) scale     Comments Reviewed RPE and dyspnea scale, THR and program prescription with pt today.  Pt voiced understanding and was given a copy of goals to take home. Maysa is doing well in rehab and has completed her 5th session of CR. She is doing well with trying out the treadmill but walks better on the track. She is on level 3 now on the nustep. Will continue to monitor and progress as able, Keturah continues to do well in rehab.  She is on her 6th session today,  and she stated that her arms and legs feel stronger since starting the program.  She also feels like her stamina has increased too.  She is currently on level 3 on the Nustep.  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     Expected Outcomes Short: Use RPE daily to regulate intensity.  Long: Follow program prescription in THR. Short: continue to attend rehab   long: exercise outside of rehab Short:  Continue to increase level on Nustep  Long:  Continue to walk/exercise at home         Discharge Exercise Prescription (Final Exercise Prescription Changes):  Exercise Prescription Changes - 02/14/24 0800       Response to Exercise   Blood Pressure (Admit) 112/60    Blood Pressure (Exercise) 126/70    Blood Pressure (Exit) 108/60    Heart Rate (Admit) 79 bpm    Heart Rate (Exercise) 116 bpm    Heart Rate (Exit) 88 bpm    Rating of Perceived Exertion (Exercise) 12    Duration Continue with 30 min of aerobic exercise without signs/symptoms of physical distress.    Intensity THRR unchanged      Progression   Progression Continue to progress workloads to maintain intensity without signs/symptoms of physical distress.      Resistance Training   Weight y    Reps 10-15      Treadmill   MPH 1.2    Grade 0    Minutes 4    METs 1.92      NuStep   Level 3    SPM 114    Minutes 15    METs 3.1      Track   Laps 43    Minutes 12    METs 2.04          Nutrition:  Target Goals: Understanding of nutrition guidelines, daily intake of sodium 1500mg , cholesterol 200mg , calories 30% from fat and 7% or less from saturated fats, daily to have 5 or more servings of fruits and vegetables.  Biometrics:  Pre Biometrics - 02/02/24 0937       Pre Biometrics   Height 5' (1.524 m)    Weight 126 lb 15.8 oz (57.6 kg)    Waist Circumference 34 inches    Hip Circumference 35.5 inches    Waist to Hip Ratio 0.96 %    BMI  (Calculated) 24.8    Grip Strength 18 kg    Single Leg Stand 30 seconds           Nutrition Therapy Plan and Nutrition Goals:   Nutrition Assessments:  MEDIFICTS Score Key: >=70  Need to make dietary changes  40-70 Heart Healthy Diet <= 40 Therapeutic Level Cholesterol Diet  Flowsheet Row CARDIAC REHAB PHASE II ORIENTATION from 02/02/2024 in [facility]  Picture Your Plate Total Score on Admission 55   Picture Your Plate Scores: <59 Unhealthy dietary pattern with much room for improvement. 41-50 Dietary pattern unlikely to meet recommendations for good health and room for improvement. 51-60 More healthful dietary pattern, with some room for improvement.  >60 Healthy dietary pattern, although there may be some specific behaviors that could be improved.    Nutrition Goals Re-Evaluation:  Nutrition Goals Re-Evaluation     Row Name 02/15/24 [phone]             Goals   Nutrition Goal Healthy eating       Comment Anira stated that her appetite has not been good while she was on Farxiga , and that she has lost 10-11 lbs while being on this medication.   Her physician took her off the Farxiga  yesterday, so she is hoping her appetite will improve.  She eats mainly grilled or baked chicken/turkey, and she tries to stay away from red meat.  She eats plenty of fruits and vegetables, and she is also incorporating salads into her diet.       Expected Outcome Short:  Appetite will improve now that Farxiga  is d/c  Long:  Continue to practice healthy eating habits          Nutrition Goals Discharge (Final Nutrition Goals Re-Evaluation):  Nutrition Goals Re-Evaluation - 02/15/24 0948       Goals   Nutrition Goal Healthy eating    Comment Macaila stated that her appetite has not been good while she was on Farxiga , and that she has lost 10-11 lbs while being on this medication.   Her physician took her off the Farxiga  yesterday, so she is hoping her appetite will  improve.  She eats mainly grilled or baked chicken/turkey, and she tries to stay away from red meat.  She eats plenty of fruits and vegetables, and she is also incorporating salads into her diet.    Expected Outcome Short:  Appetite will improve now that Farxiga  is d/c  Long:  Continue to practice healthy eating habits          Psychosocial: Target Goals: Acknowledge presence or absence of significant depression and/or stress, maximize coping skills, provide positive support system. Participant is able to verbalize types and ability to use techniques and skills needed for reducing stress and depression.  Initial Review & Psychosocial Screening:  Initial Psych Review & Screening - 01/24/24 0824       Initial Review   Current issues with None Identified      Family Dynamics   Good Support System? Yes    Comments Spouse and son (43 years old) live in home.      Barriers   Psychosocial barriers to participate in program There are no identifiable barriers or psychosocial needs.      Screening Interventions   Interventions Encouraged to exercise    Expected Outcomes Short Term goal: Utilizing psychosocial counselor, staff and physician to assist with identification of specific Stressors or current issues interfering with healing process. Setting desired goal for each stressor or current issue identified.;Long Term Goal: Stressors or current issues are controlled or eliminated.;Short Term goal: Identification and review with participant of any Quality of Life or Depression concerns found by scoring the questionnaire.;Long Term goal: The participant improves quality of  Life and PHQ9 Scores as seen by post scores and/or verbalization of changes          Quality of Life Scores:  Quality of Life - 02/07/24 0832       Quality of Life   Select Quality of Life      Quality of Life Scores   Health/Function Pre 23.93 %    Socioeconomic Pre 27.13 %    Psych/Spiritual Pre 28.07 %    Family  Pre 30 %    GLOBAL Pre 26.36 %         Scores of 19 and below usually indicate a poorer quality of life in these areas.  A difference of  2-3 points is a clinically meaningful difference.  A difference of 2-3 points in the total score of the Quality of Life Index has been associated with significant improvement in overall quality of life, self-image, physical symptoms, and general health in studies assessing change in quality of life.  PHQ-9: Review Flowsheet       02/02/2024  Depression screen PHQ 2/9  Decreased Interest 0  Down, Depressed, Hopeless 0  PHQ - 2 Score 0  Altered sleeping 1  Tired, decreased energy 1  Change in appetite 1  Feeling bad or failure about yourself  0  Trouble concentrating 0  Moving slowly or fidgety/restless 0  Suicidal thoughts 0  PHQ-9 Score 3  Difficult doing work/chores Not difficult at all   Interpretation of Total Score  Total Score Depression Severity:  1-4 = Minimal depression, 5-9 = Mild depression, 10-14 = Moderate depression, 15-19 = Moderately severe depression, 20-27 = Severe depression   Psychosocial Evaluation and Intervention:  Psychosocial Evaluation - 02/02/24 0953       Psychosocial Evaluation & Interventions   Interventions Stress management education;Relaxation education;Encouraged to exercise with the program and follow exercise prescription    Comments Patient's PHQ-9 score was 3. She denies any depression or anxiety. She says she does not sleep well which has been a chronic issues for her. She does not use any sleep aides. She says she only wants to do 6 weeks which would be 12 sessions. 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    Expected Outcomes Short Term: Patient will start the program and attend consistently. Long Term: Patient will complete the program meeting personal goals.    Continue Psychosocial Services  Follow up required by staff          Psychosocial Re-Evaluation:  Psychosocial Re-Evaluation      Row Name 02/15/24 [phone]             Psychosocial Re-Evaluation   Current issues with Current Sleep Concerns       Comments Mahdiya denies any issues with anxiety, depression, or feeling stressed.  She does have trouble sleeping at night.  She stated that she sleeps maybe an hour or 2 before waking up.  We discussed trying an over the counter medication like Melatonin to see if that will help her sleep.  She has a good family support system and currently has identifiable psychosocial barriers.       Expected Outcomes Short:  Try Melatonin to see if that will help her sleep  Long:  Continue to have no identifiable psychosocial barriers       Interventions Encouraged to attend Cardiac Rehabilitation for the exercise       Continue Psychosocial Services  Follow up required by staff  Psychosocial Discharge (Final Psychosocial Re-Evaluation):  Psychosocial Re-Evaluation - 02/15/24 0951       Psychosocial Re-Evaluation   Current issues with Current Sleep Concerns    Comments Rayvon denies any issues with anxiety, depression, or feeling stressed.  She does have trouble sleeping at night.  She stated that she sleeps maybe an hour or 2 before waking up.  We discussed trying an over the counter medication like Melatonin to see if that will help her sleep.  She has a good family support system and currently has identifiable psychosocial barriers.    Expected Outcomes Short:  Try Melatonin to see if that will help her sleep  Long:  Continue to have no identifiable psychosocial barriers    Interventions Encouraged to attend Cardiac Rehabilitation for the exercise    Continue Psychosocial Services  Follow up required by staff          Vocational Rehabilitation: Provide vocational rehab assistance to qualifying candidates.   Vocational Rehab Evaluation & Intervention:  Vocational Rehab - 01/24/24 0854       Initial Vocational Rehab Evaluation & Intervention   Assessment shows need  for Vocational Rehabilitation No          Education: Education Goals: Education classes will be provided on a weekly basis, covering required topics. Participant will state understanding/return demonstration of topics presented.  Learning Barriers/Preferences:  Learning Barriers/Preferences - 01/24/24 9166       Learning Barriers/Preferences   Learning Barriers None    Learning Preferences Group Instruction;Individual Instruction;Skilled Demonstration;Verbal Instruction          Education Topics: Hypertension, Hypertension Reduction -Define heart disease and high blood pressure. Discus how high blood pressure affects the body and ways to reduce high blood pressure.   Exercise and Your Heart -Discuss why it is important to exercise, the FITT principles of exercise, normal and abnormal responses to exercise, and how to exercise safely.   Angina -Discuss definition of angina, causes of angina, treatment of angina, and how to decrease risk of having angina.   Cardiac Medications -Review what the following cardiac medications are used for, how they affect the body, and side effects that may occur when taking the medications.  Medications include Aspirin , Beta blockers, calcium channel blockers, ACE Inhibitors, angiotensin receptor blockers, diuretics, digoxin, and antihyperlipidemics.   Congestive Heart Failure -Discuss the definition of CHF, how to live with CHF, the signs and symptoms of CHF, and how keep track of weight and sodium intake.   Heart Disease and Intimacy -Discus the effect sexual activity has on the heart, how changes occur during intimacy as we age, and safety during sexual activity.   Smoking Cessation / COPD -Discuss different methods to quit smoking, the health benefits of quitting smoking, and the definition of COPD.   Nutrition I: Fats -Discuss the types of cholesterol, what cholesterol does to the heart, and how cholesterol levels can be  controlled.   Nutrition II: Labels -Discuss the different components of food labels and how to read food label   Heart Parts/Heart Disease and PAD -Discuss the anatomy of the heart, the pathway of blood circulation through the heart, and these are affected by heart disease.   Stress I: Signs and Symptoms -Discuss the causes of stress, how stress may lead to anxiety and depression, and ways to limit stress.   Stress II: Relaxation -Discuss different types of relaxation techniques to limit stress.   Warning Signs of Stroke / TIA -Discuss definition of a stroke, what  the signs and symptoms are of a stroke, and how to identify when someone is having stroke.   Knowledge Questionnaire Score:  Knowledge Questionnaire Score - 02/02/24 0952       Knowledge Questionnaire Score   Pre Score 22/26          Core Components/Risk Factors/Patient Goals at Admission:  Personal Goals and Risk Factors at Admission - 01/24/24 0835       Core Components/Risk Factors/Patient Goals on Admission    Weight Management Weight Maintenance    Diabetes Yes    Intervention Provide education about signs/symptoms and action to take for hypo/hyperglycemia.;Provide education about proper nutrition, including hydration, and aerobic/resistive exercise prescription along with prescribed medications to achieve blood glucose in normal ranges: Fasting glucose 65-99 mg/dL    Expected Outcomes Short Term: Participant verbalizes understanding of the signs/symptoms and immediate care of hyper/hypoglycemia, proper foot care and importance of medication, aerobic/resistive exercise and nutrition plan for blood glucose control.;Long Term: Attainment of HbA1C < 7%.    Heart Failure Yes    Intervention Provide a combined exercise and nutrition program that is supplemented with education, support and counseling about heart failure. Directed toward relieving symptoms such as shortness of breath, decreased exercise tolerance,  and extremity edema.    Expected Outcomes Improve functional capacity of life;Short term: Attendance in program 2-3 days a week with increased exercise capacity. Reported lower sodium intake. Reported increased fruit and vegetable intake. Reports medication compliance.;Short term: Daily weights obtained and reported for increase. Utilizing diuretic protocols set by physician.;Long term: Adoption of self-care skills and reduction of barriers for early signs and symptoms recognition and intervention leading to self-care maintenance.    Hypertension Yes    Intervention Provide education on lifestyle modifcations including regular physical activity/exercise, weight management, moderate sodium restriction and increased consumption of fresh fruit, vegetables, and low fat dairy, alcohol moderation, and smoking cessation.;Monitor prescription use compliance.    Expected Outcomes Short Term: Continued assessment and intervention until BP is < 140/34mm HG in hypertensive participants. < 130/21mm HG in hypertensive participants with diabetes, heart failure or chronic kidney disease.;Long Term: Maintenance of blood pressure at goal levels.    Lipids Yes    Intervention Provide education and support for participant on nutrition & aerobic/resistive exercise along with prescribed medications to achieve LDL 70mg , HDL >40mg .    Expected Outcomes Short Term: Participant states understanding of desired cholesterol values and is compliant with medications prescribed. Participant is following exercise prescription and nutrition guidelines.;Long Term: Cholesterol controlled with medications as prescribed, with individualized exercise RX and with personalized nutrition plan. Value goals: LDL < 70mg , HDL > 40 mg.          Core Components/Risk Factors/Patient Goals Review:   Goals and Risk Factor Review     Row Name 02/15/24 0955             Core Components/Risk Factors/Patient Goals Review   Personal Goals Review  Hypertension;Diabetes;Lipids       Review Ambert is currently on her 6th session of CR.  She is continuing to increase her level on the [place], as well as walk multiple laps on the track.  Her current weight is 124.3.  She has lost 10-11 lbs being on Farxiga ; however, her physician just d/c this medication so she is hoping her appetite will improve.  She takes her BP and cholesterol medication as prescribed, as well her diabetes medication.  She checks her BP at home almost everyday, and she checks her  BS daily.  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       Expected Outcomes Short:  Continue to attend CR  Long:  Continue to take her medications as prescribed          Core Components/Risk Factors/Patient Goals at Discharge (Final Review):   Goals and Risk Factor Review - 02/15/24 0955       Core Components/Risk Factors/Patient Goals Review   Personal Goals Review Hypertension;Diabetes;Lipids    Review Kathreen is currently on her 6th session of CR.  She is continuing to increase her level on the [place], as well as walk multiple laps on the track.  Her current weight is 124.3.  She has lost 10-11 lbs being on Farxiga ; however, her physician just d/c this medication so she is hoping her appetite will improve.  She takes her BP and cholesterol medication as prescribed, as well her diabetes medication.  She checks her BP at home almost everyday, and she checks her BS daily.  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    Expected Outcomes Short:  Continue to attend CR  Long:  Continue to take her medications as prescribed          ITP Comments:  ITP Comments     Row Name 01/24/24 0810 02/02/24 0957 02/06/24 0921 02/28/24 1259     ITP Comments Completed virtual orientation today.  EP evaluation is scheduled for 02/02/24 at 0830 .  Documentation for diagnosis can be found in [facility] encounter 01/06/24. Patient arrived for 1st  visit/orientation/education at 0830. Patient was referred to CR by Dr. Lonni Cash due to STEMI/Stent placement. During orientation advised patient on arrival and appointment times what to wear, what to do before, during and after exercise. Reviewed attendance and class policy.  Pt is scheduled to return Cardiac Rehab on 02/06/24 at 915. Pt was advised to come to class 15 minutes before class starts.  Discussed RPE/Dpysnea scales. Patient participated in warm up stretches. Patient was able to complete 6 minute walk test.  Telemetry:NSR. Patient was measured for the equipment. Discussed equipment safety with patient. Took patient pre-anthropometric measurements. Patient finished visit at 930. First full day of exercise!  Patient was oriented to gym and equipment including functions, settings, policies, and procedures.  Patient's individual exercise prescription and treatment plan were reviewed.  All starting workloads were established based on the results of the 6 minute walk test done at initial orientation visit.  The plan for exercise progression was also introduced and progression will be customized based on patient's performance and goals. 30 day review completed. ITP sent to Dr. Dorn Ross, Medical Director of Cardiac Rehab. Continue with ITP unless changes are made by physician.       Comments: 30 day review     [1]  Current Outpatient Medications:    acetaminophen  (TYLENOL ) 500 MG tablet, Take 500 mg by mouth daily as needed for mild pain (pain score 1-3)., Disp: , Rfl:    aspirin  EC 81 MG tablet, Take 81 mg by mouth daily. Swallow whole., Disp: , Rfl:    calcium carbonate (OSCAL) 1500 (600 Ca) MG TABS tablet, Take 600 mg of elemental calcium by mouth 2 (two) times daily with a meal., Disp: , Rfl:    dapagliflozin  propanediol (FARXIGA ) 10 MG TABS tablet, Take 1 tablet (10 mg total) by mouth daily before breakfast., Disp: 90 tablet, Rfl: 3   Evolocumab  (  REPATHA  SURECLICK) 140 MG/ML  SOAJ, Inject 140 mg into the skin every 14 (fourteen) days., Disp: 6 mL, Rfl: 3   glimepiride (AMARYL) 1 MG tablet, Take 1 mg by mouth daily with breakfast., Disp: , Rfl:    losartan  (COZAAR ) 25 MG tablet, Take 1 tablet (25 mg total) by mouth daily., Disp: 90 tablet, Rfl: 3   metFORMIN (GLUCOPHAGE) 500 MG tablet, Take by mouth 2 (two) times daily with a meal., Disp: , Rfl:    metoprolol  succinate (TOPROL -XL) 25 MG 24 hr tablet, Take 0.5 tablets (12.5 mg total) by mouth daily., Disp: 30 tablet, Rfl: 3   omega-3 acid ethyl esters (LOVAZA) 1 g capsule, Take 1 g by mouth 2 (two) times daily., Disp: , Rfl:    spironolactone  (ALDACTONE ) 25 MG tablet, Take 1 tablet (25 mg total) by mouth daily., Disp: 30 tablet, Rfl: 11   ticagrelor  (BRILINTA ) 90 MG TABS tablet, Take 1 tablet (90 mg total) by mouth 2 (two) times daily., Disp: 60 tablet, Rfl: 11 [2]  Social History Tobacco Use  Smoking Status Never  Smokeless Tobacco Never

## 2024-02-29 ENCOUNTER — Encounter (HOSPITAL_COMMUNITY)
Admission: RE | Admit: 2024-02-29 | Discharge: 2024-02-29 | Disposition: A | Discharge status: Home / Self Care | Source: Ambulatory Visit | Attending: Cardiovascular Disease | Admitting: Cardiovascular Disease

## 2024-02-29 DIAGNOSIS — I2102 ST elevation (STEMI) myocardial infarction involving left anterior descending coronary artery: Secondary | ICD-10-CM

## 2024-02-29 DIAGNOSIS — Z955 Presence of coronary angioplasty implant and graft: Secondary | ICD-10-CM

## 2024-02-29 NOTE — Progress Notes (Signed)
 Daily Session Note  Patient Details  Name: Laurie Lamb MRN: [id number] Date of Birth: [birth year] Referring Provider:   Flowsheet Row CARDIAC REHAB PHASE II ORIENTATION from 02/02/2024 in [facility] CARDIAC REHABILITATION  Referring Provider Verlin Key MD    Encounter Date: 02/29/2024  Check In:  Session Check In - 02/29/24 0920       Check-In   Supervising physician immediately available to respond to emergencies See telemetry face sheet for immediately available MD    Location [facility]    Staff Present Powell Benders, BS, Exercise Physiologist;Brittany Jackquline, BSN, RN, Rosalba Gelineau, MA, RCEP, CCRP, CCET    Virtual Visit No    Medication changes reported     No    Fall or balance concerns reported    No    Tobacco Cessation No Change    Warm-up and Cool-down Performed on first and last piece of equipment    Resistance Training Performed Yes    VAD Patient? No    PAD/SET Patient? No      Pain Assessment   Currently in Pain? No/denies    Multiple Pain Sites No          Capillary Blood Glucose: No results found for this or any previous visit (from the past 24 hours).    Tobacco Use History[1]  Goals Met:  Independence with exercise equipment Exercise tolerated well No report of concerns or symptoms today Strength training completed today  Goals Unmet:  Not Applicable  Comments: Pt able to follow exercise prescription today without complaint.  Will continue to monitor for progression.        [1]  Social History Tobacco Use  Smoking Status Never  Smokeless Tobacco Never

## 2024-03-05 ENCOUNTER — Encounter (HOSPITAL_COMMUNITY)
Admission: RE | Admit: 2024-03-05 | Discharge: 2024-03-05 | Disposition: A | Discharge status: Home / Self Care | Source: Ambulatory Visit | Attending: Cardiovascular Disease | Admitting: Cardiovascular Disease

## 2024-03-05 DIAGNOSIS — I2102 ST elevation (STEMI) myocardial infarction involving left anterior descending coronary artery: Secondary | ICD-10-CM | POA: Insufficient documentation

## 2024-03-05 DIAGNOSIS — Z955 Presence of coronary angioplasty implant and graft: Secondary | ICD-10-CM | POA: Insufficient documentation

## 2024-03-05 NOTE — Progress Notes (Signed)
 Daily Session Note  Patient Details  Name: Laurie Lamb MRN: [id number] Date of Birth: November 19, 1950 Referring Provider:   Flowsheet Row CARDIAC REHAB PHASE II ORIENTATION from 02/02/2024 in [facility] CARDIAC REHABILITATION  Referring Provider Verlin Key MD    Encounter Date: 03/05/2024  Check In:  Session Check In - 03/05/24 9072       Check-In   Supervising physician immediately available to respond to emergencies See telemetry face sheet for immediately available MD    Location [facility]    Staff Present Powell Benders, BS, Exercise Physiologist;Brittany Jackquline, BSN, RN, Rosalba Gelineau, MA, RCEP, CCRP, CCET    Virtual Visit No    Medication changes reported     No    Fall or balance concerns reported    No    Tobacco Cessation No Change    Warm-up and Cool-down Performed on first and last piece of equipment    Resistance Training Performed Yes    VAD Patient? No    PAD/SET Patient? No      Pain Assessment   Currently in Pain? No/denies    Multiple Pain Sites No          Capillary Blood Glucose: No results found for this or any previous visit (from the past 24 hours).    Tobacco Use History[1]  Goals Met:  Independence with exercise equipment Exercise tolerated well No report of concerns or symptoms today Strength training completed today  Goals Unmet:  Not Applicable  Comments: Pt able to follow exercise prescription today without complaint.  Will continue to monitor for progression.        [1]  Social History Tobacco Use  Smoking Status Never  Smokeless Tobacco Never

## 2024-03-07 ENCOUNTER — Encounter (HOSPITAL_COMMUNITY)
Admission: RE | Admit: 2024-03-07 | Discharge: 2024-03-07 | Disposition: A | Discharge status: Home / Self Care | Source: Ambulatory Visit | Attending: Cardiovascular Disease | Admitting: Cardiovascular Disease

## 2024-03-07 DIAGNOSIS — I2102 ST elevation (STEMI) myocardial infarction involving left anterior descending coronary artery: Secondary | ICD-10-CM

## 2024-03-07 DIAGNOSIS — Z955 Presence of coronary angioplasty implant and graft: Secondary | ICD-10-CM

## 2024-03-07 NOTE — Progress Notes (Signed)
 Daily Session Note  Patient Details  Name: Laurie Lamb MRN: [id number] Date of Birth: 07-08-1950 Referring Provider:   Flowsheet Row CARDIAC REHAB PHASE II ORIENTATION from 02/02/2024 in [facility] CARDIAC REHABILITATION  Referring Provider Verlin Key MD    Encounter Date: 03/07/2024  Check In:  Session Check In - 03/07/24 0859       Check-In   Supervising physician immediately available to respond to emergencies See telemetry face sheet for immediately available MD    Location [facility]    Staff Present Powell Benders, BS, Exercise Physiologist;Saturnino Liew Jackquline, BSN, RN, Rosalba Gelineau, MA, RCEP, CCRP, CCET    Virtual Visit No    Medication changes reported     No    Fall or balance concerns reported    No    Tobacco Cessation No Change    Warm-up and Cool-down Performed on first and last piece of equipment    Resistance Training Performed Yes    VAD Patient? No    PAD/SET Patient? No      Pain Assessment   Currently in Pain? No/denies          Capillary Blood Glucose: No results found for this or any previous visit (from the past 24 hours).    Tobacco Use History[1]  Goals Met:  Independence with exercise equipment Exercise tolerated well No report of concerns or symptoms today Strength training completed today  Goals Unmet:  Not Applicable  Comments: Pt able to follow exercise prescription today without complaint.  Will continue to monitor for progression.        [1]  Social History Tobacco Use  Smoking Status Never  Smokeless Tobacco Never

## 2024-03-08 ENCOUNTER — Ambulatory Visit: Attending: Physician Assistant | Admitting: Physician Assistant

## 2024-03-08 ENCOUNTER — Telehealth: Payer: Self-pay | Admitting: Physician Assistant

## 2024-03-08 VITALS — BP 130/68 | HR 61 | Ht 61.0 in | Wt 123.0 lb

## 2024-03-08 DIAGNOSIS — I255 Ischemic cardiomyopathy: Secondary | ICD-10-CM | POA: Diagnosis not present

## 2024-03-08 DIAGNOSIS — Z79899 Other long term (current) drug therapy: Secondary | ICD-10-CM | POA: Diagnosis not present

## 2024-03-08 DIAGNOSIS — I2510 Atherosclerotic heart disease of native coronary artery without angina pectoris: Secondary | ICD-10-CM

## 2024-03-08 DIAGNOSIS — E785 Hyperlipidemia, unspecified: Secondary | ICD-10-CM

## 2024-03-08 DIAGNOSIS — I10 Essential (primary) hypertension: Secondary | ICD-10-CM

## 2024-03-08 DIAGNOSIS — E119 Type 2 diabetes mellitus without complications: Secondary | ICD-10-CM

## 2024-03-08 MED ORDER — SACUBITRIL-VALSARTAN 49-51 MG PO TABS: 1.0000 | ORAL_TABLET | Freq: Two times a day (BID) | ORAL | 2 refills | Status: DC

## 2024-03-08 MED ORDER — REPATHA SURECLICK 140 MG/ML ~~LOC~~ SOAJ: 140.0000 mg | SUBCUTANEOUS | 3 refills | Status: DC

## 2024-03-08 NOTE — Patient Instructions (Addendum)
 Medication Instructions:  STOP LOSARTAN   START ENTRESTO  49-51 MG TWICE A DAY *If you need a refill on your cardiac medications before your next appointment, please call your pharmacy*  Lab Work: BMET IN 2 WEEKS If you have labs (blood work) drawn today and your tests are completely normal, you will receive your results only by: MyChart Message (if you have MyChart) OR A paper copy in the mail If you have any lab test that is abnormal or we need to change your treatment, we will call you to review the results.  Testing/Procedures:[address] SCHEDULED FOR 04/23/24 Your physician has requested that you have an echocardiogram. Echocardiography is a painless test that uses sound waves to create images of your heart. It provides your doctor with information about the size and shape of your heart and how well your hearts chambers and valves are working. This procedure takes approximately one hour. There are no restrictions for this procedure. Please do NOT wear cologne, perfume, aftershave, or lotions (deodorant is allowed). Please arrive 15 minutes prior to your appointment time.  Please note: We ask at that you not bring children with you during ultrasound (echo/ vascular) testing. Due to room size and safety concerns, children are not allowed in the ultrasound rooms during exams. Our front office staff cannot provide observation of children in our lobby area while testing is being conducted. An adult accompanying a patient to their appointment will only be allowed in the ultrasound room at the discretion of the ultrasound technician under special circumstances. We apologize for any inconvenience.   Follow-Up: At [facility], you and your health needs are our priority.  As part of our continuing mission to provide you with exceptional heart care, our providers are all part of one team.  This team includes your primary Cardiologist (physician) and Advanced Practice Providers or APPs  (Physician Assistants and Nurse Practitioners) who all work together to provide you with the care you need, when you need it.  Your next appointment:   KEEP FOLLOW UP MARCH 2026  Provider:   Lonni Cash, MD

## 2024-03-08 NOTE — Progress Notes (Signed)
 " Cardiology Office Note   Date:  03/08/2024  ID:  Laurie Lamb, Laurie Lamb, MRN [id number] PCP: Bluford Jacqulyn MATSU, DO  [facility] Providers Cardiologist:  Lonni Cash, MD     History of Present Illness Laurie Lamb is a 74 y.o. female with PMH of hypertension, hyperlipidemia, DM2 and recently diagnosed CAD.  She presented to the hospital in Nov 2025 with on and off chest pain for 2 weeks.  EKG showed anterior ST elevation with inferior ST depression.  Code STEMI was activated.  She was taken urgently for cardiac catheterization on 01/06/2024 which showed 99% proximal LAD stenosis successfully treated with DES, EF 20 to 25%.  Echocardiogram obtained after the cardiac authorization was 30 to 35% ejection fraction.  She is statin intolerant and only takes Lovaza at home.  She was referred to lipid clinic for consideration of PCSK9 inhibitor.  Metoprolol  and spironolactone  was started during this hospitalization.  So plans to start on SGLT2 inhibitor and ARNI as outpatient.  She has been seen by lipid clinic this morning who started him on Repatha .   I last saw the patient in November 2025 at which time she was doing well.  She tolerated Jardiance however it was too expensive.  I prescribed Farxiga  and losartan .  She could not tolerate Farxiga  due to side effect.  She is tolerating losartan .  The blood pressure in the 130s today.  I will transition losartan  to Entresto  49-51 mg twice a day.  She appears to be euvolemic on exam.  She will need a basic metabolic panel in 2 weeks.  Otherwise, she has upcoming echocardiogram scheduled next month and has follow-up with Dr. Cash in March.  She is doing cardiac rehab and has been able to walk 2 miles a day without any exertional chest pain   ROS:   She denies chest pain, palpitations, dyspnea, pnd, orthopnea, n, v, dizziness, syncope, edema, weight gain, or early satiety. All other systems reviewed and are otherwise negative except  as noted above.    Studies Reviewed      Cardiac Studies & Procedures   ______________________________________________________________________________________________ CARDIAC CATHETERIZATION  CARDIAC CATHETERIZATION 01/06/2024  Conclusion   Prox LAD lesion is 99% stenosed.   A drug-eluting stent was successfully placed using a STENT SYNERGY XD 3.0X16.   Post intervention, there is a 0% residual stenosis.  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 Stable BP, elevated lactic acid with trend down after case  Recommendations: Will admit to the ICU. She is hemodynamically stable despite having an elevated lactic acid and LV systolic dysfunction. Case discussed with Shock team. Will not perform right heart cath given her hemodynamic stability. DAPT with ASA and Brilinta  for one year. Aggrastat  infusion two hours post PCI. Will not start a statin given her history of statin intolerance.  Findings Coronary Findings Diagnostic  Dominance: Right  Left Anterior Descending Vessel is large. Prox LAD lesion is 99% stenosed.  Left Circumflex Vessel is large.  Right Coronary Artery Vessel is large.  Intervention  Prox LAD lesion Stent CATH VISTA GUIDE 6FR XBLD 3.5 guide catheter was inserted. Lesion crossed with guidewire using a WIRE ASAHI PROWATER 180CM. Pre-stent angioplasty was performed using a BALLOON EMERGE MR 2.0X12. A drug-eluting stent was successfully placed using a STENT SYNERGY XD 3.0X16. Post-stent angioplasty was  performed using a BALLOON Emmet EMERGE MR [id number]. Post-Intervention Lesion Assessment The intervention was successful. Pre-interventional TIMI flow is 1. Post-intervention TIMI flow is 3. No complications occurred at this lesion. There is a 0% residual stenosis  post intervention.     ECHOCARDIOGRAM  ECHOCARDIOGRAM COMPLETE 01/06/2024  Narrative ECHOCARDIOGRAM REPORT    Patient Name:   Laurie Lamb Date of Exam: 01/06/2024 Medical Rec #:  [id number]        Height:       60.0 in Accession #:    [id number]       Weight:       134.0 lb Date of Birth:  [birth year]        BSA:          1.574 m Patient Age:    73 years         BP:           116/65 mmHg Patient Gender: F                HR:           86 bpm. Exam Location:  Inpatient  Procedure: 2D Echo, Cardiac Doppler, Color Doppler and Intracardiac Opacification Agent (Both Spectral and Color Flow Doppler were utilized during procedure).  Indications:    CAD I25.10  History:        Patient has no prior history of Echocardiogram examinations. CAD and STEMI; Risk Factors:Diabetes and Hypertension.  Sonographer:    BERNARDA ROCKS Referring Phys: 59 Laurie Lamb   Sonographer Comments: Technically challenging study due to limited acoustic windows. IMPRESSIONS   1. Technically challenging study. 2. No left ventricular mural or apical thrombus/thrombi. 3. Left ventricular ejection fraction, by estimation, is 30 to 35%. The left ventricle has moderately decreased function. The left ventricle demonstrates regional wall motion abnormalities (see scoring diagram/findings for description). Left ventricular diastolic parameters were normal. 4. Right ventricular systolic function is normal. The right ventricular size is normal. Moderately increased right ventricular wall thickness. Tricuspid regurgitation signal is inadequate for assessing PA pressure. 5. The mitral valve is grossly normal. No evidence of mitral valve regurgitation. No evidence of mitral stenosis. 6. The aortic valve was not well visualized. Aortic valve regurgitation is not visualized. No aortic stenosis is present. 7. The inferior vena cava is normal in size with <50% respiratory variability, suggesting right atrial  pressure of 8 mmHg.  Comparison(s): No prior Echocardiogram.  FINDINGS Left Ventricle: Left ventricular ejection fraction, by estimation, is 30 to 35%. The left ventricle has moderately decreased function. The left ventricle demonstrates regional wall motion abnormalities. Definity  contrast agent was given IV to delineate the left ventricular endocardial borders. The left ventricular internal cavity size was small. There is no left ventricular hypertrophy. Left ventricular diastolic parameters were normal.   LV Wall Scoring: The entire anterior wall, entire anterior septum, entire apex, mid and distal inferior wall, mid anterolateral segment, and mid inferoseptal segment are hypokinetic. The posterior wall, basal anterolateral segment, basal inferior segment, and basal inferoseptal segment are normal.  Right Ventricle: The right ventricular size is normal. Moderately increased right ventricular wall thickness. Right ventricular systolic function is normal. Tricuspid regurgitation signal is inadequate for assessing PA pressure.  Left Atrium: Left atrial size was normal in size.  Right Atrium: Right atrial size was normal in size.  Pericardium: There is no evidence of pericardial effusion.  Mitral Valve: The mitral valve is grossly normal. No evidence of mitral valve regurgitation. No evidence of mitral  valve stenosis. MV peak gradient, 6.9 mmHg. The mean mitral valve gradient is 1.0 mmHg.  Tricuspid Valve: The tricuspid valve is grossly normal. Tricuspid valve regurgitation is not demonstrated. No evidence of tricuspid stenosis.  Aortic Valve: The aortic valve was not well visualized. Aortic valve regurgitation is not visualized. No aortic stenosis is present. Aortic valve mean gradient measures 3.0 mmHg. Aortic valve peak gradient measures 5.6 mmHg. Aortic valve area, by VTI measures 1.83 cm.  Pulmonic Valve: The pulmonic valve was not well visualized. Pulmonic valve regurgitation is  not visualized.  Aorta: The aortic root and ascending aorta are structurally normal, with no evidence of dilitation.  Venous: The inferior vena cava is normal in size with less than 50% respiratory variability, suggesting right atrial pressure of 8 mmHg.  IAS/Shunts: The interatrial septum appears to be lipomatous. The atrial septum is grossly normal.   LEFT VENTRICLE PLAX 2D LVIDd:         3.60 cm      Diastology LVIDs:         2.40 cm      LV e' medial:    6.42 cm/s LV PW:         0.90 cm      LV E/e' medial:  9.8 LV IVS:        1.00 cm      LV e' lateral:   5.11 cm/s LVOT diam:     1.70 cm      LV E/e' lateral: 12.3 LV SV:         37 LV SV Index:   24 LVOT Area:     2.27 cm  LV Volumes (MOD) LV vol d, MOD A2C: 95.3 ml LV vol d, MOD A4C: 124.0 ml LV vol s, MOD A2C: 68.1 ml LV vol s, MOD A4C: 73.9 ml LV SV MOD A2C:     27.2 ml LV SV MOD A4C:     124.0 ml LV SV MOD BP:      42.1 ml  RIGHT VENTRICLE             IVC RV Basal diam:  2.60 cm     IVC diam: 1.60 cm RV S prime:     18.10 cm/s TAPSE (M-mode): 1.8 cm  LEFT ATRIUM             Index        RIGHT ATRIUM          Index LA diam:        2.50 cm 1.59 cm/m   RA Area:     8.56 cm LA Vol (A2C):   21.6 ml 13.72 ml/m  RA Volume:   13.80 ml 8.76 ml/m LA Vol (A4C):   26.6 ml 16.89 ml/m LA Biplane Vol: 24.0 ml 15.24 ml/m AORTIC VALVE                    PULMONIC VALVE AV Area (Vmax):    1.85 cm     PV Vmax:       0.85 m/s AV Area (Vmean):   1.62 cm     PV Peak grad:  2.9 mmHg AV Area (VTI):     1.83 cm AV Vmax:           118.00 cm/s AV Vmean:          77.100 cm/s AV VTI:            0.203 m AV Peak Grad:  5.6 mmHg AV Mean Grad:      3.0 mmHg LVOT Vmax:         96.00 cm/s LVOT Vmean:        55.100 cm/s LVOT VTI:          0.164 m LVOT/AV VTI ratio: 0.81  AORTA Ao Root diam: 2.90 cm Ao Asc diam:  3.10 cm  MITRAL VALVE MV Area (PHT): 4.57 cm     SHUNTS MV Area VTI:   2.36 cm     Systemic VTI:  0.16 m MV  Peak grad:  6.9 mmHg     Systemic Diam: 1.70 cm MV Mean grad:  1.0 mmHg MV Vmax:       1.31 m/s MV Vmean:      43.8 cm/s MV Decel Time: 166 msec MV E velocity: 63.00 cm/s MV A velocity: 138.00 cm/s MV E/A ratio:  0.46  Sunit Tolia Electronically signed by [employer] Signature Date/Time: 01/06/2024/5:42:14 PM    Final          ______________________________________________________________________________________________      Risk Assessment/Calculations           Physical Exam VS:  BP 130/68 (Cuff Size: Normal)   Pulse 61   Ht 5' 1 (1.549 m)   Wt 123 lb (55.8 kg)   SpO2 98%   BMI 23.24 kg/m        Wt Readings from Last 3 Encounters:  03/08/24 123 lb (55.8 kg)  02/02/24 126 lb 15.8 oz (57.6 kg)  01/19/24 132 lb 3.2 oz (60 kg)    GEN: Well nourished, well developed in no acute distress NECK: No JVD; No carotid bruits CARDIAC: RRR, no murmurs, rubs, gallops RESPIRATORY:  Clear to auscultation without rales, wheezing or rhonchi  ABDOMEN: Soft, non-tender, non-distended EXTREMITIES:  No edema; No deformity   ASSESSMENT AND PLAN  CAD: Previously underwent a DES to LAD in November 2025.  Continue aspirin  and Brilinta .  Denies any chest pain.  Ischemic cardiomyopathy: EF 30 to 35% after anterior STEMI.  She could not tolerate Farxiga .  Continue metoprolol  succinate and spironolactone .  Switch losartan  to Entresto  49-51 mg twice a day.  Repeat echocardiogram next month  Hypertension: Blood pressure well-controlled  Hyperlipidemia: On Repatha .  DM2: Managed by primary care provider.       Dispo: Follow-up with Dr. Verlin March as previously scheduled.  Signed, Scot Ford, PA  "

## 2024-03-08 NOTE — Telephone Encounter (Signed)
 Pt c/o medication issue:  1. Name of Medication: sacubitril -valsartan  (ENTRESTO ) 49-51 MG   2. How are you currently taking this medication (dosage and times per day)? None   3. Are you having a reaction (difficulty breathing--STAT)? No   4. What is your medication issue? Pt did not pick up entresto  because it was $322. Please advise

## 2024-03-09 ENCOUNTER — Other Ambulatory Visit (HOSPITAL_COMMUNITY): Payer: Self-pay

## 2024-03-12 ENCOUNTER — Telehealth: Payer: Self-pay | Admitting: Pharmacy Technician

## 2024-03-12 ENCOUNTER — Encounter (HOSPITAL_COMMUNITY)
Admission: RE | Admit: 2024-03-12 | Discharge: 2024-03-12 | Disposition: A | Source: Ambulatory Visit | Attending: Cardiovascular Disease

## 2024-03-12 DIAGNOSIS — Z955 Presence of coronary angioplasty implant and graft: Secondary | ICD-10-CM

## 2024-03-12 DIAGNOSIS — I2102 ST elevation (STEMI) myocardial infarction involving left anterior descending coronary artery: Secondary | ICD-10-CM

## 2024-03-12 NOTE — Telephone Encounter (Signed)
 Patient already has a [employer] grant that will cover the cost of Entresto  along with farxiga :   [employer]  grant  that will help cover the cost of farxiga  & entresto  Total amount awarded, 7500.  Effective: 12/21/23 - 12/19/24   APW:[id number] ERW:[id number] Group:[id number] PI:[id number] [employer] ID: [id number]

## 2024-03-12 NOTE — Telephone Encounter (Signed)
"  Patient is following up. Please advise.  "

## 2024-03-12 NOTE — Progress Notes (Signed)
 Daily Session Note  Patient Details  Name: Laurie Lamb MRN: [id number] Date of Birth: 02/09/1951 Referring Provider:   Flowsheet Row CARDIAC REHAB PHASE II ORIENTATION from 02/02/2024 in [facility] CARDIAC REHABILITATION  Referring Provider Verlin Key MD    Encounter Date: 03/12/2024  Check In:  Session Check In - 03/12/24 0924       Check-In   Supervising physician immediately available to respond to emergencies See telemetry face sheet for immediately available MD    Location [facility]    Staff Present Powell Benders, BS, Exercise Physiologist;Brittany Jackquline, BSN, RN, Rosalba Gelineau, MA, RCEP, CCRP, CCET    Virtual Visit No    Medication changes reported     No    Fall or balance concerns reported    No    Tobacco Cessation No Change    Warm-up and Cool-down Performed on first and last piece of equipment    Resistance Training Performed Yes    VAD Patient? No    PAD/SET Patient? No      Pain Assessment   Currently in Pain? No/denies    Multiple Pain Sites No          Capillary Blood Glucose: No results found for this or any previous visit (from the past 24 hours).    Tobacco Use History[1]  Goals Met:  Improved SOB with ADL's Exercise tolerated well No report of concerns or symptoms today Strength training completed today  Goals Unmet:  Not Applicable  Comments: Pt able to follow exercise prescription today without complaint.  Will continue to monitor for progression.        [1]  Social History Tobacco Use  Smoking Status Never  Smokeless Tobacco Never

## 2024-03-14 ENCOUNTER — Encounter (HOSPITAL_COMMUNITY)
Admission: RE | Admit: 2024-03-14 | Discharge: 2024-03-14 | Disposition: A | Discharge status: Home / Self Care | Source: Ambulatory Visit | Attending: Cardiovascular Disease | Admitting: Cardiovascular Disease

## 2024-03-14 VITALS — Ht 60.0 in | Wt 124.8 lb

## 2024-03-14 DIAGNOSIS — Z955 Presence of coronary angioplasty implant and graft: Secondary | ICD-10-CM

## 2024-03-14 DIAGNOSIS — I2102 ST elevation (STEMI) myocardial infarction involving left anterior descending coronary artery: Secondary | ICD-10-CM

## 2024-03-14 NOTE — Patient Instructions (Signed)
 Discharge Patient Instructions  Patient Details  Name: Laurie Lamb MRN: [id number] Date of Birth: May 30, 1950 Referring Provider:  Shona Norleen PEDLAR, MD   Number of Visits: 17   Reason for Discharge:  Early Exit:  Personal and patient is exercising at home  Smoking History:  Social History   Tobacco Use  Smoking Status Never  Smokeless Tobacco Never    Diagnosis:  Status post coronary artery stent placement  ST elevation myocardial infarction involving left anterior descending (LAD) coronary artery [facility])  Initial Exercise Prescription:  Initial Exercise Prescription - 02/02/24 0900       Date of Initial Exercise RX and Referring Provider   Date 02/02/24    Referring Provider Verlin Key MD      Treadmill   MPH 1.4    Grade 0    Minutes 15    METs 1.8      NuStep   Level 2    SPM 50    Minutes 15    METs 2      Prescription Details   Frequency (times per week) 2    Duration Progress to 30 minutes of continuous aerobic without signs/symptoms of physical distress      Intensity   THRR 40-80% of Max Heartrate 99-132    Ratings of Perceived Exertion 11-13    Perceived Dyspnea 0-4      Resistance Training   Training Prescription Yes    Weight 4    Reps 10-15          Discharge Exercise Prescription (Final Exercise Prescription Changes):  Exercise Prescription Changes - 03/05/24 1500       Response to Exercise   Blood Pressure (Admit) 104/70    Blood Pressure (Exit) 100/60    Heart Rate (Admit) 80 bpm    Heart Rate (Exercise) 121 bpm    Heart Rate (Exit) 86 bpm    Rating of Perceived Exertion (Exercise) 13    Duration Continue with 30 min of aerobic exercise without signs/symptoms of physical distress.    Intensity THRR unchanged      Progression   Progression Continue to progress workloads to maintain intensity without signs/symptoms of physical distress.      Resistance Training   Weight 4    Reps 10-15      NuStep   Level 3     SPM 123    Minutes 15    METs 4.2      Track   Laps 45    Minutes 15    METs 2.1          Functional Capacity:  6 Minute Walk     Row Name 02/02/24 0934 03/14/24 0938       6 Minute Walk   Phase Initial Discharge    Distance 1200 feet 1710 feet    Distance % Change -- 42.5 %    Distance Feet Change -- 510 ft    Walk Time 6 minutes 6 minutes    # of Rest Breaks 0 0    MPH 2.27 3.24    METS 2.59 3.45    RPE 11 11    VO2 Peak 9.07 12.07    Symptoms No No    Resting HR 66 bpm 86 bpm    Resting BP 130/60 126/60    Resting Oxygen Saturation  95 % 96 %    Exercise Oxygen Saturation  during 6 min walk 96 % 96 %    Max Ex. HR  103 bpm 96 bpm    Max Ex. BP 126/60 128/62    2 Minute Post BP 118/60 --       Quality of Life:  Quality of Life - 02/07/24 0832       Quality of Life   Select Quality of Life      Quality of Life Scores   Health/Function Pre 23.93 %    Socioeconomic Pre 27.13 %    Psych/Spiritual Pre 28.07 %    Family Pre 30 %    GLOBAL Pre 26.36 %           Nutrition & Weight - Outcomes:  Pre Biometrics - 02/02/24 0937       Pre Biometrics   Height 5' (1.524 m)    Weight 57.6 kg    Waist Circumference 34 inches    Hip Circumference 35.5 inches    Waist to Hip Ratio 0.96 %    BMI (Calculated) 24.8    Grip Strength 18 kg    Single Leg Stand 30 seconds          Post Biometrics - 03/14/24 0940        Post  Biometrics   Height 5' (1.524 m)    Weight 56.6 kg    Waist Circumference 34 inches    Hip Circumference 35 inches    Waist to Hip Ratio 0.97 %    BMI (Calculated) 24.37    Grip Strength 18 kg    Single Leg Stand 30 seconds          Goals reviewed with patient; copy given to patient.

## 2024-03-14 NOTE — Progress Notes (Signed)
 Daily Session Note  Patient Details  Name: IREM STONEHAM MRN: [id number] Date of Birth: [birth year] Referring Provider:   Flowsheet Row CARDIAC REHAB PHASE II ORIENTATION from 02/02/2024 in [facility] CARDIAC REHABILITATION  Referring Provider Verlin Key MD    Encounter Date: 03/14/2024  Check In:  Session Check In - 03/14/24 0852       Check-In   Supervising physician immediately available to respond to emergencies See telemetry face sheet for immediately available MD    Location [facility]    Staff Present Laymon Rattler, BSN, RN, WTA-C;Heather Con, BS, Exercise Physiologist    Virtual Visit No    Medication changes reported     No    Fall or balance concerns reported    No    Tobacco Cessation No Change    Warm-up and Cool-down Performed on first and last piece of equipment    Resistance Training Performed Yes    VAD Patient? No    PAD/SET Patient? No      Pain Assessment   Currently in Pain? No/denies          Capillary Blood Glucose: No results found for this or any previous visit (from the past 24 hours).    Tobacco Use History[1]  Goals Met:  Independence with exercise equipment Exercise tolerated well No report of concerns or symptoms today Strength training completed today  Goals Unmet:  Not Applicable  Comments:  Latiffany graduated today from  rehab with 17 sessions completed.  Details of the patient's exercise prescription and what She needs to do in order to continue the prescription and progress were discussed with patient.  Patient was given a copy of prescription and goals.  Patient verbalized understanding. Ashyah plans to continue to exercise by walking at home which she already does.         [1]  Social History Tobacco Use  Smoking Status Never  Smokeless Tobacco Never

## 2024-03-14 NOTE — Progress Notes (Signed)
 Cardiac Individual Treatment Plan  Patient Details  Name: Laurie Lamb MRN: [id number] Date of Birth: 1950-09-08 Referring Provider:   Flowsheet Row CARDIAC REHAB PHASE II ORIENTATION from 02/02/2024 in [facility]  Referring Provider Verlin Key MD    Initial Encounter Date:  Flowsheet Row CARDIAC REHAB PHASE II ORIENTATION from 02/02/2024 in [facility]  Date 02/02/24    Visit Diagnosis: Status post coronary artery stent placement  ST elevation myocardial infarction involving left anterior descending (LAD) coronary artery (HCC)  Patient's Home Medications on Admission: Current Medications[1]  Past Medical History: Past Medical History:  Diagnosis Date   Arthritis    Diabetes mellitus without complication (HCC)    Hypertension     Tobacco Use: Tobacco Use History[2]  Labs: Review Flowsheet       Latest Ref Rng & Units 01/06/2024  Labs for ITP Cardiac and Pulmonary Rehab  Cholestrol 0 - 200 mg/dL 820   LDL (calc) 0 - 99 mg/dL 897   HDL-C >59 mg/dL 37   Trlycerides <849 mg/dL 798   Hemoglobin J8r 4.8 - 5.6 % 6.7   TCO2 22 - 32 mmol/L 20      Exercise Target Goals: Exercise Program Goal: Individual exercise prescription set using results from initial 6 min walk test and THRR while considering  patients activity barriers and safety.   Exercise Prescription Goal: Initial exercise prescription builds to 30-45 minutes a day of aerobic activity, 2-3 days per week.  Home exercise guidelines will be given to patient during program as part of exercise prescription that the participant will acknowledge.   Education: Aerobic Exercise: - Group verbal and visual presentation on the components of exercise prescription. Introduces F.I.T.T principle from ACSM for exercise prescriptions.  Reviews F.I.T.T. principles of aerobic exercise including progression. Written material provided at class time.   Education:  Resistance Exercise: - Group verbal and visual presentation on the components of exercise prescription. Introduces F.I.T.T principle from ACSM for exercise prescriptions  Reviews F.I.T.T. principles of resistance exercise including progression. Written material provided at class time.    Education: Exercise & Equipment Safety: - Individual verbal instruction and demonstration of equipment use and safety with use of the equipment.   Education: Exercise Physiology & General Exercise Guidelines: - Group verbal and written instruction with models to review the exercise physiology of the cardiovascular system and associated critical values. Provides general exercise guidelines with specific guidelines to those with heart or lung disease. Written material provided at class time. Flowsheet Row CARDIAC REHAB PHASE II EXERCISE from 02/15/2024 in [facility]  Education need identified 02/02/24    Education: Flexibility, Balance, Mind/Body Relaxation: - Group verbal and visual presentation with interactive activity on the components of exercise prescription. Introduces F.I.T.T principle from ACSM for exercise prescriptions. Reviews F.I.T.T. principles of flexibility and balance exercise training including progression. Also discusses the mind body connection.  Reviews various relaxation techniques to help reduce and manage stress (i.e. Deep breathing, progressive muscle relaxation, and visualization). Balance handout provided to take home. Written material provided at class time. Flowsheet Row CARDIAC REHAB PHASE II EXERCISE from 03/07/2024 in [place] [facility]  Date 03/07/24  Educator [facility]  Instruction Review Code 1- Verbalizes Understanding    Activity Barriers & Risk Stratification:  Activity Barriers & Cardiac Risk Stratification - 01/24/24 0845       Activity Barriers & Cardiac Risk Stratification   Activity Barriers Arthritis    Cardiac Risk Stratification  Moderate  6 Minute Walk:  6 Minute Walk     Row Name 02/02/24 0934 03/14/24 0938       6 Minute Walk   Phase Initial Discharge    Distance 1200 feet 1710 feet    Distance % Change -- 42.5 %    Distance Feet Change -- 510 ft    Walk Time 6 minutes 6 minutes    # of Rest Breaks 0 0    MPH 2.27 3.24    METS 2.59 3.45    RPE 11 11    VO2 Peak 9.07 12.07    Symptoms No No    Resting HR 66 bpm 86 bpm    Resting BP 130/60 126/60    Resting Oxygen Saturation  95 % 96 %    Exercise Oxygen Saturation  during 6 min walk 96 % 96 %    Max Ex. HR 103 bpm 96 bpm    Max Ex. BP 126/60 128/62    2 Minute Post BP 118/60 --       Oxygen Initial Assessment:   Oxygen Re-Evaluation:   Oxygen Discharge (Final Oxygen Re-Evaluation):   Initial Exercise Prescription:  Initial Exercise Prescription - 02/02/24 0900       Date of Initial Exercise RX and Referring Provider   Date 02/02/24    Referring Provider Verlin Key MD      Treadmill   MPH 1.4    Grade 0    Minutes 15    METs 1.8      NuStep   Level 2    SPM 50    Minutes 15    METs 2      Prescription Details   Frequency (times per week) 2    Duration Progress to 30 minutes of continuous aerobic without signs/symptoms of physical distress      Intensity   THRR 40-80% of Max Heartrate 99-132    Ratings of Perceived Exertion 11-13    Perceived Dyspnea 0-4      Resistance Training   Training Prescription Yes    Weight 4    Reps 10-15          Perform Capillary Blood Glucose checks as needed.  Exercise Prescription Changes:   Exercise Prescription Changes     Row Name 02/02/24 0900 02/14/24 0800 03/05/24 1500         Response to Exercise   Blood Pressure (Admit) 130/60 112/60 104/70     Blood Pressure (Exercise) 126/60 126/70 --     Blood Pressure (Exit) 118/60 108/60 100/60     Heart Rate (Admit) 66 bpm 79 bpm 80 bpm     Heart Rate (Exercise) 103 bpm 116 bpm 121 bpm     Heart  Rate (Exit) 78 bpm 88 bpm 86 bpm     Oxygen Saturation (Admit) 95 % -- --     Oxygen Saturation (Exercise) 96 % -- --     Oxygen Saturation (Exit) 96 % -- --     Rating of Perceived Exertion (Exercise) 11 12 13      Perceived Dyspnea (Exercise) 0 -- --     Duration -- Continue with 30 min of aerobic exercise without signs/symptoms of physical distress. Continue with 30 min of aerobic exercise without signs/symptoms of physical distress.     Intensity -- THRR unchanged THRR unchanged       Progression   Progression -- Continue to progress workloads to maintain intensity without signs/symptoms of physical distress. Continue to progress  workloads to maintain intensity without signs/symptoms of physical distress.       Resistance Training   Weight -- y 4     Reps -- 10-15 10-15       Treadmill   MPH -- 1.2 --     Grade -- 0 --     Minutes -- 4 --     METs -- 1.92 --       NuStep   Level -- 3 3     SPM -- 114 123     Minutes -- 15 15     METs -- 3.1 4.2       Track   Laps -- 43 45     Minutes -- 12 15     METs -- 2.04 2.1        Exercise Comments:   Exercise Comments     Row Name 02/06/24 0921 03/14/24 0854         Exercise Comments First full day of exercise!  Patient was oriented to gym and equipment including functions, settings, policies, and procedures.  Patient's individual exercise prescription and treatment plan were reviewed.  All starting workloads were established based on the results of the 6 minute walk test done at initial orientation visit.  The plan for exercise progression was also introduced and progression will be customized based on patient's performance and goals. Signora graduated today from  rehab with 17 sessions completed.  Details of the patient's exercise prescription and what She needs to do in order to continue the prescription and progress were discussed with patient.  Patient was given a copy of prescription and goals.  Patient verbalized  understanding. Meagan plans to continue to exercise by walking at home which she already does.         Exercise Goals and Review:   Exercise Goals     Row Name 01/24/24 0845             Exercise Goals   Increase Physical Activity Yes       Intervention Provide advice, education, support and counseling about physical activity/exercise needs.;Develop an individualized exercise prescription for aerobic and resistive training based on initial evaluation findings, risk stratification, comorbidities and participant's personal goals.       Expected Outcomes Short Term: Attend rehab on a regular basis to increase amount of physical activity.;Long Term: Add in home exercise to make exercise part of routine and to increase amount of physical activity.;Long Term: Exercising regularly at least 3-5 days a week.       Increase Strength and Stamina Yes       Intervention Provide advice, education, support and counseling about physical activity/exercise needs.;Develop an individualized exercise prescription for aerobic and resistive training based on initial evaluation findings, risk stratification, comorbidities and participant's personal goals.       Expected Outcomes Short Term: Increase workloads from initial exercise prescription for resistance, speed, and METs.;Short Term: Perform resistance training exercises routinely during rehab and add in resistance training at home;Long Term: Improve cardiorespiratory fitness, muscular endurance and strength as measured by increased METs and functional capacity ( )       Able to understand and use rate of perceived exertion (RPE) scale Yes       Intervention Provide education and explanation on how to use RPE scale       Expected Outcomes Short Term: Able to use RPE daily in rehab to express subjective intensity level;Long Term:  Able to use RPE to guide intensity  level when exercising independently       Able to understand and use Dyspnea scale Yes        Intervention Provide education and explanation on how to use Dyspnea scale       Expected Outcomes Short Term: Able to use Dyspnea scale daily in rehab to express subjective sense of shortness of breath during exertion;Long Term: Able to use Dyspnea scale to guide intensity level when exercising independently       Knowledge and understanding of Target Heart Rate Range (THRR) Yes       Intervention Provide education and explanation of THRR including how the numbers were predicted and where they are located for reference       Expected Outcomes Short Term: Able to state/look up THRR;Long Term: Able to use THRR to govern intensity when exercising independently;Short Term: Able to use daily as guideline for intensity in rehab       Able to check pulse independently Yes       Intervention Provide education and demonstration on how to check pulse in carotid and radial arteries.;Review the importance of being able to check your own pulse for safety during independent exercise       Expected Outcomes Short Term: Able to explain why pulse checking is important during independent exercise;Long Term: Able to check pulse independently and accurately       Understanding of Exercise Prescription Yes       Intervention Provide education, explanation, and written materials on patient's individual exercise prescription       Expected Outcomes Short Term: Able to explain program exercise prescription;Long Term: Able to explain home exercise prescription to exercise independently          Exercise Goals Re-Evaluation :  Exercise Goals Re-Evaluation     Row Name 02/06/24 0921 02/14/24 0831 02/15/24 0945 03/05/24 0932 03/07/24 0838     Exercise Goal Re-Evaluation   Exercise Goals Review Increase Physical Activity;Increase Strength and Stamina;Able to understand and use Dyspnea scale;Able to check pulse independently;Knowledge and understanding of Target Heart Rate Range (THRR);Able to understand and use rate of  perceived exertion (RPE) scale;Understanding of Exercise Prescription Increase Physical Activity;Increase Strength and Stamina;Understanding of Exercise Prescription Increase Physical Activity;Increase Strength and Stamina;Able to understand and use rate of perceived exertion (RPE) scale Increase Physical Activity;Increase Strength and Stamina;Able to understand and use rate of perceived exertion (RPE) scale Increase Physical Activity;Increase Strength and Stamina;Understanding of Exercise Prescription   Comments Reviewed RPE and dyspnea scale, THR and program prescription with pt today.  Pt voiced understanding and was given a copy of goals to take home. Magalie is doing well in rehab and has completed her 5th session of CR. She is doing well with trying out the treadmill but walks better on the track. She is on level 3 now on the nustep. Will continue to monitor and progress as able, Canna continues to do well in rehab.  She is on her 6th session today, and she stated that her arms and legs feel stronger since starting the program.  She also feels like her stamina has increased too.  She is currently on level 3 on the Nustep.  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 Cylinda is doing well in rehab! She is increasing her steps and level on the Nustep and she always gets atleast 55 laps when doing the track. She is active at home  by walking several times a day. Jowana has completed 12 sessions of CR. She is doing well in rehab and enjoys walking the track. Will continue to monitor and progress as able.   Expected Outcomes Short: Use RPE daily to regulate intensity.  Long: Follow program prescription in THR. Short: continue to attend rehab   long: exercise outside of rehab Short:  Continue to increase level on Nustep  Long:  Continue to walk/exercise at home Short:  Continue to increase level on Nustep  Long:  Continue to walk/exercise at home  Short: continue to attend rehab  long: continue to exerise      Discharge Exercise Prescription (Final Exercise Prescription Changes):  Exercise Prescription Changes - 03/05/24 1500       Response to Exercise   Blood Pressure (Admit) 104/70    Blood Pressure (Exit) 100/60    Heart Rate (Admit) 80 bpm    Heart Rate (Exercise) 121 bpm    Heart Rate (Exit) 86 bpm    Rating of Perceived Exertion (Exercise) 13    Duration Continue with 30 min of aerobic exercise without signs/symptoms of physical distress.    Intensity THRR unchanged      Progression   Progression Continue to progress workloads to maintain intensity without signs/symptoms of physical distress.      Resistance Training   Weight 4    Reps 10-15      NuStep   Level 3    SPM 123    Minutes 15    METs 4.2      Track   Laps 45    Minutes 15    METs 2.1          Nutrition:  Target Goals: Understanding of nutrition guidelines, daily intake of sodium 1500mg , cholesterol 200mg , calories 30% from fat and 7% or less from saturated fats, daily to have 5 or more servings of fruits and vegetables.  Education: Nutrition 1 -Group instruction provided by verbal, written material, interactive activities, discussions, models, and posters to present general guidelines for heart healthy nutrition including macronutrients, label reading, and promoting whole foods over processed counterparts. Education serves as pensions consultant of discussion of heart healthy eating for all. Written material provided at class time. Flowsheet Row CARDIAC REHAB PHASE II EXERCISE from 03/14/2024 in [place] [facility]  Date 03/14/24  Educator hb  Instruction Review Code 1- Verbalizes Understanding     Education: Nutrition 2 -Group instruction provided by verbal, written material, interactive activities, discussions, models, and posters to present general guidelines for heart healthy nutrition including sodium, cholesterol, and saturated  fat. Providing guidance of habit forming to improve blood pressure, cholesterol, and body weight. Written material provided at class time. Flowsheet Row CARDIAC REHAB PHASE II EXERCISE from 03/14/2024 in [place] [facility]  Date 03/14/24  Educator hb  Instruction Review Code 1- Verbalizes Understanding      Biometrics:  Pre Biometrics - 02/02/24 0937       Pre Biometrics   Height 5' (1.524 m)    Weight 57.6 kg    Waist Circumference 34 inches    Hip Circumference 35.5 inches    Waist to Hip Ratio 0.96 %    BMI (Calculated) 24.8    Grip Strength 18 kg    Single Leg Stand 30 seconds          Post Biometrics - 03/14/24 0940        Post  Biometrics   Height 5' (1.524 m)  Weight 56.6 kg    Waist Circumference 34 inches    Hip Circumference 35 inches    Waist to Hip Ratio 0.97 %    BMI (Calculated) 24.37    Grip Strength 18 kg    Single Leg Stand 30 seconds          Nutrition Therapy Plan and Nutrition Goals:   Nutrition Assessments:  MEDIFICTS Score Key: >=70 Need to make dietary changes  40-70 Heart Healthy Diet <= 40 Therapeutic Level Cholesterol Diet  Flowsheet Row CARDIAC REHAB PHASE II ORIENTATION from 02/02/2024 in [facility]  Picture Your Plate Total Score on Admission 55   Picture Your Plate Scores: <59 Unhealthy dietary pattern with much room for improvement. 41-50 Dietary pattern unlikely to meet recommendations for good health and room for improvement. 51-60 More healthful dietary pattern, with some room for improvement.  >60 Healthy dietary pattern, although there may be some specific behaviors that could be improved.    Nutrition Goals Re-Evaluation:  Nutrition Goals Re-Evaluation     Row Name 02/15/24 0948 03/05/24 0936           Goals   Nutrition Goal Healthy eating Healthy eating      Comment Vicie stated that her appetite has not been good while she was on Farxiga , and that she has lost 10-11  lbs while being on this medication.   Her physician took her off the Farxiga  yesterday, so she is hoping her appetite will improve.  She eats mainly grilled or baked chicken/turkey, and she tries to stay away from red meat.  She eats plenty of fruits and vegetables, and she is also incorporating salads into her diet. Victorine states that her appetite is much better since coming off the Farxiga  medicine! She has maintained a healthy weight as well. She is eating plenty of fresh fruits and veggies and drinking her water .      Expected Outcome Short:  Appetite will improve now that Farxiga  is d/c  Long:  Continue to practice healthy eating habits Short: Continue adequate water  intake. Long: Continue healthy eating.         Nutrition Goals Discharge (Final Nutrition Goals Re-Evaluation):  Nutrition Goals Re-Evaluation - 03/05/24 0936       Goals   Nutrition Goal Healthy eating    Comment Sahmya states that her appetite is much better since coming off the Farxiga  medicine! She has maintained a healthy weight as well. She is eating plenty of fresh fruits and veggies and drinking her water .    Expected Outcome Short: Continue adequate water  intake. Long: Continue healthy eating.          Psychosocial: Target Goals: Acknowledge presence or absence of significant depression and/or stress, maximize coping skills, provide positive support system. Participant is able to verbalize types and ability to use techniques and skills needed for reducing stress and depression.   Education: Stress, Anxiety, and Depression - Group verbal and visual presentation to define topics covered.  Reviews how body is impacted by stress, anxiety, and depression.  Also discusses healthy ways to reduce stress and to treat/manage anxiety and depression. Written material provided at class time. Flowsheet Row CARDIAC REHAB PHASE II EXERCISE from 02/15/2024 in [place] [facility]  Education need identified 02/02/24     Education: Sleep Hygiene -Provides group verbal and written instruction about how sleep can affect your health.  Define sleep hygiene, discuss sleep cycles and impact of sleep habits. Review good sleep hygiene tips.  Initial Review & Psychosocial Screening:  Initial Psych Review & Screening - 01/24/24 0824       Initial Review   Current issues with None Identified      Family Dynamics   Good Support System? Yes    Comments Spouse and son (30 years old) live in home.      Barriers   Psychosocial barriers to participate in program There are no identifiable barriers or psychosocial needs.      Screening Interventions   Interventions Encouraged to exercise    Expected Outcomes Short Term goal: Utilizing psychosocial counselor, staff and physician to assist with identification of specific Stressors or current issues interfering with healing process. Setting desired goal for each stressor or current issue identified.;Long Term Goal: Stressors or current issues are controlled or eliminated.;Short Term goal: Identification and review with participant of any Quality of Life or Depression concerns found by scoring the questionnaire.;Long Term goal: The participant improves quality of Life and PHQ9 Scores as seen by post scores and/or verbalization of changes          Quality of Life Scores:   Quality of Life - 02/07/24 0832       Quality of Life   Select Quality of Life      Quality of Life Scores   Health/Function Pre 23.93 %    Socioeconomic Pre 27.13 %    Psych/Spiritual Pre 28.07 %    Family Pre 30 %    GLOBAL Pre 26.36 %         Scores of 19 and below usually indicate a poorer quality of life in these areas.  A difference of  2-3 points is a clinically meaningful difference.  A difference of 2-3 points in the total score of the Quality of Life Index has been associated with significant improvement in overall quality of life, self-image, physical symptoms, and general  health in studies assessing change in quality of life.  PHQ-9: Review Flowsheet       02/02/2024  Depression screen PHQ 2/9  Decreased Interest 0  Down, Depressed, Hopeless 0  PHQ - 2 Score 0  Altered sleeping 1  Tired, decreased energy 1  Change in appetite 1  Feeling bad or failure about yourself  0  Trouble concentrating 0  Moving slowly or fidgety/restless 0  Suicidal thoughts 0  PHQ-9 Score 3  Difficult doing work/chores Not difficult at all   Interpretation of Total Score  Total Score Depression Severity:  1-4 = Minimal depression, 5-9 = Mild depression, 10-14 = Moderate depression, 15-19 = Moderately severe depression, 20-27 = Severe depression   Psychosocial Evaluation and Intervention:  Psychosocial Evaluation - 02/02/24 0953       Psychosocial Evaluation & Interventions   Interventions Stress management education;Relaxation education;Encouraged to exercise with the program and follow exercise prescription    Comments Patient's PHQ-9 score was 3. She denies any depression or anxiety. She says she does not sleep well which has been a chronic issues for her. She does not use any sleep aides. She says she only wants to do 6 weeks which would be 12 sessions. 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    Expected Outcomes Short Term: Patient will start the program and attend consistently. Long Term: Patient will complete the program meeting personal goals.    Continue Psychosocial Services  Follow up required by staff          Psychosocial Re-Evaluation:  Psychosocial Re-Evaluation  Row Name 02/15/24 9048 03/05/24 0935           Psychosocial Re-Evaluation   Current issues with Current Sleep Concerns None Identified      Comments Demeshia denies any issues with anxiety, depression, or feeling stressed.  She does have trouble sleeping at night.  She stated that she sleeps maybe an hour or 2 before waking up.  We discussed trying an over the counter medication  like Melatonin to see if that will help her sleep.  She has a good family support system and currently has identifiable psychosocial barriers. Prarthana denies any current stressors. She states her sleep has gotten better since the last time ger goals were done. She has a good support system with her family.      Expected Outcomes Short:  Try Melatonin to see if that will help her sleep  Long:  Continue to have no identifiable psychosocial barriers Short: Continue to attend rehab. Long: Continue having a stress free lifestyle.      Interventions Encouraged to attend Cardiac Rehabilitation for the exercise Encouraged to attend Cardiac Rehabilitation for the exercise      Continue Psychosocial Services  Follow up required by staff Follow up required by staff         Psychosocial Discharge (Final Psychosocial Re-Evaluation):  Psychosocial Re-Evaluation - 03/05/24 0935       Psychosocial Re-Evaluation   Current issues with None Identified    Comments Molleigh denies any current stressors. She states her sleep has gotten better since the last time ger goals were done. She has a good support system with her family.    Expected Outcomes Short: Continue to attend rehab. Long: Continue having a stress free lifestyle.    Interventions Encouraged to attend Cardiac Rehabilitation for the exercise    Continue Psychosocial Services  Follow up required by staff          Vocational Rehabilitation: Provide vocational rehab assistance to qualifying candidates.   Vocational Rehab Evaluation & Intervention:  Vocational Rehab - 01/24/24 0854       Initial Vocational Rehab Evaluation & Intervention   Assessment shows need for Vocational Rehabilitation No          Education: Education Goals: Education classes will be provided on a variety of topics geared toward better understanding of heart health and risk factor modification. Participant will state understanding/return demonstration of topics presented  as noted by education test scores.  Learning Barriers/Preferences:  Learning Barriers/Preferences - 01/24/24 9166       Learning Barriers/Preferences   Learning Barriers None    Learning Preferences Group Instruction;Individual Instruction;Skilled Demonstration;Verbal Instruction          General Cardiac Education Topics:  AED/CPR: - Group verbal and written instruction with the use of models to demonstrate the basic use of the AED with the basic ABC's of resuscitation.   Test and Procedures: - Group verbal and visual presentation and models provide information about basic cardiac anatomy and function. Reviews the testing methods done to diagnose heart disease and the outcomes of the test results. Describes the treatment choices: Medical Management, Angioplasty, or Coronary Bypass Surgery for treating various heart conditions including Myocardial Infarction, Angina, Valve Disease, and Cardiac Arrhythmias. Written material provided at class time. Flowsheet Row CARDIAC REHAB PHASE II EXERCISE from 02/15/2024 in [place] [facility]  Date 02/15/24  Educator [facility]  Instruction Review Code 1- Verbalizes Understanding    Medication Safety: - Group verbal and visual instruction to review commonly  prescribed medications for heart and lung disease. Reviews the medication, class of the drug, and side effects. Includes the steps to properly store meds and maintain the prescription regimen. Written material provided at class time.   Intimacy: - Group verbal instruction through game format to discuss how heart and lung disease can affect sexual intimacy. Written material provided at class time.   Know Your Numbers and Heart Failure: - Group verbal and visual instruction to discuss disease risk factors for cardiac and pulmonary disease and treatment options.  Reviews associated critical values for Overweight/Obesity, Hypertension, Cholesterol, and Diabetes.  Discusses basics of heart  failure: signs/symptoms and treatments.  Introduces Heart Failure Zone chart for action plan for heart failure. Written material provided at class time.   Infection Prevention: - Provides verbal and written material to individual with discussion of infection control including proper hand washing and proper equipment cleaning during exercise session.   Falls Prevention: - Provides verbal and written material to individual with discussion of falls prevention and safety.   Other: -Provides group and verbal instruction on various topics (see comments)   Knowledge Questionnaire Score:  Knowledge Questionnaire Score - 02/02/24 0952       Knowledge Questionnaire Score   Pre Score 22/26          Core Components/Risk Factors/Patient Goals at Admission:  Personal Goals and Risk Factors at Admission - 01/24/24 0835       Core Components/Risk Factors/Patient Goals on Admission    Weight Management Weight Maintenance    Diabetes Yes    Intervention Provide education about signs/symptoms and action to take for hypo/hyperglycemia.;Provide education about proper nutrition, including hydration, and aerobic/resistive exercise prescription along with prescribed medications to achieve blood glucose in normal ranges: Fasting glucose 65-99 mg/dL    Expected Outcomes Short Term: Participant verbalizes understanding of the signs/symptoms and immediate care of hyper/hypoglycemia, proper foot care and importance of medication, aerobic/resistive exercise and nutrition plan for blood glucose control.;Long Term: Attainment of HbA1C < 7%.    Heart Failure Yes    Intervention Provide a combined exercise and nutrition program that is supplemented with education, support and counseling about heart failure. Directed toward relieving symptoms such as shortness of breath, decreased exercise tolerance, and extremity edema.    Expected Outcomes Improve functional capacity of life;Short term: Attendance in program 2-3  days a week with increased exercise capacity. Reported lower sodium intake. Reported increased fruit and vegetable intake. Reports medication compliance.;Short term: Daily weights obtained and reported for increase. Utilizing diuretic protocols set by physician.;Long term: Adoption of self-care skills and reduction of barriers for early signs and symptoms recognition and intervention leading to self-care maintenance.    Hypertension Yes    Intervention Provide education on lifestyle modifcations including regular physical activity/exercise, weight management, moderate sodium restriction and increased consumption of fresh fruit, vegetables, and low fat dairy, alcohol moderation, and smoking cessation.;Monitor prescription use compliance.    Expected Outcomes Short Term: Continued assessment and intervention until BP is < 140/33mm HG in hypertensive participants. < 130/42mm HG in hypertensive participants with diabetes, heart failure or chronic kidney disease.;Long Term: Maintenance of blood pressure at goal levels.    Lipids Yes    Intervention Provide education and support for participant on nutrition & aerobic/resistive exercise along with prescribed medications to achieve LDL 70mg , HDL >40mg .    Expected Outcomes Short Term: Participant states understanding of desired cholesterol values and is compliant with medications prescribed. Participant is following exercise prescription and nutrition guidelines.;Long Term:  Cholesterol controlled with medications as prescribed, with individualized exercise RX and with personalized nutrition plan. Value goals: LDL < 70mg , HDL > 40 mg.          Education:Diabetes - Individual verbal and written instruction to review signs/symptoms of diabetes, desired ranges of glucose level fasting, after meals and with exercise. Acknowledge that pre and post exercise glucose checks will be done for 3 sessions at entry of program.   Core Components/Risk Factors/Patient Goals  Review:   Goals and Risk Factor Review     Row Name 02/15/24 0955 03/05/24 0938           Core Components/Risk Factors/Patient Goals Review   Personal Goals Review Hypertension;Diabetes;Lipids Hypertension;Diabetes;Lipids      Review Locklyn is currently on her 6th session of CR.  She is continuing to increase her level on the [place], as well as walk multiple laps on the track.  Her current weight is 124.3.  She has lost 10-11 lbs being on Farxiga ; however, her physician just d/c this medication so she is hoping her appetite will improve.  She takes her BP and cholesterol medication as prescribed, as well her diabetes medication.  She checks her BP at home almost everyday, and she checks her BS daily.  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 Kila is doing well in rehab! She takes her blood pressure and blood sugar everyday and keeps a close eye on them. She takes all her medicines as prescribed as well.      Expected Outcomes Short:  Continue to attend CR  Long:  Continue to take her medications as prescribed Short:  Continue to attend CR  Long:  Continue to take her medications as prescribed         Core Components/Risk Factors/Patient Goals at Discharge (Final Review):   Goals and Risk Factor Review - 03/05/24 0938       Core Components/Risk Factors/Patient Goals Review   Personal Goals Review Hypertension;Diabetes;Lipids    Review Lakedra is doing well in rehab! She takes her blood pressure and blood sugar everyday and keeps a close eye on them. She takes all her medicines as prescribed as well.    Expected Outcomes Short:  Continue to attend CR  Long:  Continue to take her medications as prescribed          ITP Comments:  ITP Comments     Row Name 01/24/24 0810 02/02/24 0957 02/06/24 0921 02/28/24 1259 03/14/24 0854   ITP Comments Completed virtual orientation today.  EP evaluation is scheduled for 02/02/24 at 0830 .  Documentation for diagnosis can  be found in [facility] encounter 01/06/24. Patient arrived for 1st visit/orientation/education at 0830. Patient was referred to CR by Dr. Lonni Cash due to STEMI/Stent placement. During orientation advised patient on arrival and appointment times what to wear, what to do before, during and after exercise. Reviewed attendance and class policy.  Pt is scheduled to return Cardiac Rehab on 02/06/24 at 915. Pt was advised to come to class 15 minutes before class starts.  Discussed RPE/Dpysnea scales. Patient participated in warm up stretches. Patient was able to complete 6 minute walk test.  Telemetry:NSR. Patient was measured for the equipment. Discussed equipment safety with patient. Took patient pre-anthropometric measurements. Patient finished visit at 930. First full day of exercise!  Patient was oriented to gym and equipment including functions, settings, policies, and procedures.  Patient's individual exercise prescription and treatment plan were reviewed.  All starting workloads were established based on the results of the 6 minute walk test done at initial orientation visit.  The plan for exercise progression was also introduced and progression will be customized based on patient's performance and goals. 30 day review completed. ITP sent to Dr. Dorn Ross, Medical Director of Cardiac Rehab. Continue with ITP unless changes are made by physician. Danaysia graduated today from  rehab with 17 sessions completed.  Details of the patient's exercise prescription and what She needs to do in order to continue the prescription and progress were discussed with patient.  Patient was given a copy of prescription and goals.  Patient verbalized understanding. Kaytelynn plans to continue to exercise by walking at home which she already does.      Comments: Discharge ITP    [1]  Current Outpatient Medications:    acetaminophen  (TYLENOL ) 500 MG tablet, Take 500 mg by mouth daily as needed for mild pain (pain score  1-3)., Disp: , Rfl:    aspirin  EC 81 MG tablet, Take 81 mg by mouth daily. Swallow whole., Disp: , Rfl:    calcium carbonate (OSCAL) 1500 (600 Ca) MG TABS tablet, Take 600 mg of elemental calcium by mouth 2 (two) times daily with a meal., Disp: , Rfl:    Evolocumab  (REPATHA  SURECLICK) 140 MG/ML SOAJ, Inject 140 mg into the skin every 14 (fourteen) days., Disp: 6 mL, Rfl: 3   glimepiride (AMARYL) 1 MG tablet, Take 1 mg by mouth daily with breakfast., Disp: , Rfl:    metFORMIN (GLUCOPHAGE) 500 MG tablet, Take by mouth 2 (two) times daily with a meal., Disp: , Rfl:    metoprolol  succinate (TOPROL -XL) 25 MG 24 hr tablet, Take 0.5 tablets (12.5 mg total) by mouth daily., Disp: 30 tablet, Rfl: 3   omega-3 acid ethyl esters (LOVAZA) 1 g capsule, Take 1 g by mouth 2 (two) times daily. (Patient not taking: Reported on 03/08/2024), Disp: , Rfl:    sacubitril -valsartan  (ENTRESTO ) 49-51 MG, Take 1 tablet by mouth 2 (two) times daily., Disp: 180 tablet, Rfl: 2   spironolactone  (ALDACTONE ) 25 MG tablet, Take 1 tablet (25 mg total) by mouth daily., Disp: 30 tablet, Rfl: 11   ticagrelor  (BRILINTA ) 90 MG TABS tablet, Take 1 tablet (90 mg total) by mouth 2 (two) times daily., Disp: 60 tablet, Rfl: 11 [2]  Social History Tobacco Use  Smoking Status Never  Smokeless Tobacco Never

## 2024-03-14 NOTE — Progress Notes (Signed)
 Discharge Progress Report  Patient Details  Name: Laurie Lamb MRN: [id number] Date of Birth: 1950-06-04 Referring Provider:   Flowsheet Row CARDIAC REHAB PHASE II ORIENTATION from 02/02/2024 in [facility]  Referring Provider Verlin Key MD     Number of Visits: 17  Reason for Discharge:  Early Exit:  Personal and patient is exercising at home  Smoking History:  Tobacco Use History[1]  Diagnosis:  Status post coronary artery stent placement  ST elevation myocardial infarction involving left anterior descending (LAD) coronary artery (HCC)  ADL [facility]:   Initial Exercise Prescription:  Initial Exercise Prescription - 02/02/24 0900       Date of Initial Exercise RX and Referring Provider   Date 02/02/24    Referring Provider Verlin Key MD      Treadmill   MPH 1.4    Grade 0    Minutes 15    METs 1.8      NuStep   Level 2    SPM 50    Minutes 15    METs 2      Prescription Details   Frequency (times per week) 2    Duration Progress to 30 minutes of continuous aerobic without signs/symptoms of physical distress      Intensity   THRR 40-80% of Max Heartrate 99-132    Ratings of Perceived Exertion 11-13    Perceived Dyspnea 0-4      Resistance Training   Training Prescription Yes    Weight 4    Reps 10-15          Discharge Exercise Prescription (Final Exercise Prescription Changes):  Exercise Prescription Changes - 03/05/24 1500       Response to Exercise   Blood Pressure (Admit) 104/70    Blood Pressure (Exit) 100/60    Heart Rate (Admit) 80 bpm    Heart Rate (Exercise) 121 bpm    Heart Rate (Exit) 86 bpm    Rating of Perceived Exertion (Exercise) 13    Duration Continue with 30 min of aerobic exercise without signs/symptoms of physical distress.    Intensity THRR unchanged      Progression   Progression Continue to progress workloads to maintain intensity without signs/symptoms of physical  distress.      Resistance Training   Weight 4    Reps 10-15      NuStep   Level 3    SPM 123    Minutes 15    METs 4.2      Track   Laps 45    Minutes 15    METs 2.1          Functional Capacity:  6 Minute Walk     Row Name 02/02/24 0934 03/14/24 0938       6 Minute Walk   Phase Initial Discharge    Distance 1200 feet 1710 feet    Distance % Change -- 42.5 %    Distance Feet Change -- 510 ft    Walk Time 6 minutes 6 minutes    # of Rest Breaks 0 0    MPH 2.27 3.24    METS 2.59 3.45    RPE 11 11    VO2 Peak 9.07 12.07    Symptoms No No    Resting HR 66 bpm 86 bpm    Resting BP 130/60 126/60    Resting Oxygen Saturation  95 % 96 %    Exercise Oxygen Saturation  during 6 min walk 96 %  96 %    Max Ex. HR 103 bpm 96 bpm    Max Ex. BP 126/60 128/62    2 Minute Post BP 118/60 --       Psychological, QOL, Others - Outcomes: PHQ 2/9:    02/02/2024    9:09 AM  Depression screen PHQ 2/9  Decreased Interest 0  Down, Depressed, Hopeless 0  PHQ - 2 Score 0  Altered sleeping 1  Tired, decreased energy 1  Change in appetite 1  Feeling bad or failure about yourself  0  Trouble concentrating 0  Moving slowly or fidgety/restless 0  Suicidal thoughts 0  PHQ-9 Score 3  Difficult doing work/chores Not difficult at all    Quality of Life:  Quality of Life - 02/07/24 0832       Quality of Life   Select Quality of Life      Quality of Life Scores   Health/Function Pre 23.93 %    Socioeconomic Pre 27.13 %    Psych/Spiritual Pre 28.07 %    Family Pre 30 %    GLOBAL Pre 26.36 %          Personal Goals: Goals established at orientation with interventions provided to work toward goal.  Personal Goals and Risk Factors at Admission - 01/24/24 0835       Core Components/Risk Factors/Patient Goals on Admission    Weight Management Weight Maintenance    Diabetes Yes    Intervention Provide education about signs/symptoms and action to take for  hypo/hyperglycemia.;Provide education about proper nutrition, including hydration, and aerobic/resistive exercise prescription along with prescribed medications to achieve blood glucose in normal ranges: Fasting glucose 65-99 mg/dL    Expected Outcomes Short Term: Participant verbalizes understanding of the signs/symptoms and immediate care of hyper/hypoglycemia, proper foot care and importance of medication, aerobic/resistive exercise and nutrition plan for blood glucose control.;Long Term: Attainment of HbA1C < 7%.    Heart Failure Yes    Intervention Provide a combined exercise and nutrition program that is supplemented with education, support and counseling about heart failure. Directed toward relieving symptoms such as shortness of breath, decreased exercise tolerance, and extremity edema.    Expected Outcomes Improve functional capacity of life;Short term: Attendance in program 2-3 days a week with increased exercise capacity. Reported lower sodium intake. Reported increased fruit and vegetable intake. Reports medication compliance.;Short term: Daily weights obtained and reported for increase. Utilizing diuretic protocols set by physician.;Long term: Adoption of self-care skills and reduction of barriers for early signs and symptoms recognition and intervention leading to self-care maintenance.    Hypertension Yes    Intervention Provide education on lifestyle modifcations including regular physical activity/exercise, weight management, moderate sodium restriction and increased consumption of fresh fruit, vegetables, and low fat dairy, alcohol moderation, and smoking cessation.;Monitor prescription use compliance.    Expected Outcomes Short Term: Continued assessment and intervention until BP is < 140/75mm HG in hypertensive participants. < 130/26mm HG in hypertensive participants with diabetes, heart failure or chronic kidney disease.;Long Term: Maintenance of blood pressure at goal levels.    Lipids  Yes    Intervention Provide education and support for participant on nutrition & aerobic/resistive exercise along with prescribed medications to achieve LDL 70mg , HDL >40mg .    Expected Outcomes Short Term: Participant states understanding of desired cholesterol values and is compliant with medications prescribed. Participant is following exercise prescription and nutrition guidelines.;Long Term: Cholesterol controlled with medications as prescribed, with individualized exercise RX and with personalized nutrition plan.  Value goals: LDL < 70mg , HDL > 40 mg.           Personal Goals Discharge:  Goals and Risk Factor Review     Row Name 02/15/24 0955 03/05/24 9061           Core Components/Risk Factors/Patient Goals Review   Personal Goals Review Hypertension;Diabetes;Lipids Hypertension;Diabetes;Lipids      Review Laurie Lamb is currently on her 6th session of CR.  She is continuing to increase her level on the Nustep, as well as walk multiple laps on the track.  Her current weight is 124.3.  She has lost 10-11 lbs being on Farxiga ; however, her physician just d/c this medication so she is hoping her appetite will improve.  She takes her BP and cholesterol medication as prescribed, as well her diabetes medication.  She checks her BP at home almost everyday, and she checks her BS daily.  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 Laurie Lamb is doing well in rehab! She takes her blood pressure and blood sugar everyday and keeps a close eye on them. She takes all her medicines as prescribed as well.      Expected Outcomes Short:  Continue to attend CR  Long:  Continue to take her medications as prescribed Short:  Continue to attend CR  Long:  Continue to take her medications as prescribed         Exercise Goals and Review:  Exercise Goals     Row Name 01/24/24 0845             Exercise Goals   Increase Physical Activity Yes       Intervention Provide advice,  education, support and counseling about physical activity/exercise needs.;Develop an individualized exercise prescription for aerobic and resistive training based on initial evaluation findings, risk stratification, comorbidities and participant's personal goals.       Expected Outcomes Short Term: Attend rehab on a regular basis to increase amount of physical activity.;Long Term: Add in home exercise to make exercise part of routine and to increase amount of physical activity.;Long Term: Exercising regularly at least 3-5 days a week.       Increase Strength and Stamina Yes       Intervention Provide advice, education, support and counseling about physical activity/exercise needs.;Develop an individualized exercise prescription for aerobic and resistive training based on initial evaluation findings, risk stratification, comorbidities and participant's personal goals.       Expected Outcomes Short Term: Increase workloads from initial exercise prescription for resistance, speed, and METs.;Short Term: Perform resistance training exercises routinely during rehab and add in resistance training at home;Long Term: Improve cardiorespiratory fitness, muscular endurance and strength as measured by increased METs and functional capacity ( )       Able to understand and use rate of perceived exertion (RPE) scale Yes       Intervention Provide education and explanation on how to use RPE scale       Expected Outcomes Short Term: Able to use RPE daily in rehab to express subjective intensity level;Long Term:  Able to use RPE to guide intensity level when exercising independently       Able to understand and use Dyspnea scale Yes       Intervention Provide education and explanation on how to use Dyspnea scale       Expected Outcomes Short Term: Able to use Dyspnea scale daily in rehab to express subjective sense of shortness of breath  during exertion;Long Term: Able to use Dyspnea scale to guide intensity level when  exercising independently       Knowledge and understanding of Target Heart Rate Range (THRR) Yes       Intervention Provide education and explanation of THRR including how the numbers were predicted and where they are located for reference       Expected Outcomes Short Term: Able to state/look up THRR;Long Term: Able to use THRR to govern intensity when exercising independently;Short Term: Able to use daily as guideline for intensity in rehab       Able to check pulse independently Yes       Intervention Provide education and demonstration on how to check pulse in carotid and radial arteries.;Review the importance of being able to check your own pulse for safety during independent exercise       Expected Outcomes Short Term: Able to explain why pulse checking is important during independent exercise;Long Term: Able to check pulse independently and accurately       Understanding of Exercise Prescription Yes       Intervention Provide education, explanation, and written materials on patient's individual exercise prescription       Expected Outcomes Short Term: Able to explain program exercise prescription;Long Term: Able to explain home exercise prescription to exercise independently          Exercise Goals Re-Evaluation:  Exercise Goals Re-Evaluation     Row Name 02/06/24 0921 02/14/24 0831 02/15/24 0945 03/05/24 0932 03/07/24 0838     Exercise Goal Re-Evaluation   Exercise Goals Review Increase Physical Activity;Increase Strength and Stamina;Able to understand and use Dyspnea scale;Able to check pulse independently;Knowledge and understanding of Target Heart Rate Range (THRR);Able to understand and use rate of perceived exertion (RPE) scale;Understanding of Exercise Prescription Increase Physical Activity;Increase Strength and Stamina;Understanding of Exercise Prescription Increase Physical Activity;Increase Strength and Stamina;Able to understand and use rate of perceived exertion (RPE) scale  Increase Physical Activity;Increase Strength and Stamina;Able to understand and use rate of perceived exertion (RPE) scale Increase Physical Activity;Increase Strength and Stamina;Understanding of Exercise Prescription   Comments Reviewed RPE and dyspnea scale, THR and program prescription with pt today.  Pt voiced understanding and was given a copy of goals to take home. Laurie Lamb is doing well in rehab and has completed her 5th session of CR. She is doing well with trying out the treadmill but walks better on the track. She is on level 3 now on the nustep. Will continue to monitor and progress as able, Laurie Lamb continues to do well in rehab.  She is on her 6th session today, and she stated that her arms and legs feel stronger since starting the program.  She also feels like her stamina has increased too.  She is currently on level 3 on the Nustep.  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 Laurie Lamb is doing well in rehab! She is increasing her steps and level on the Nustep and she always gets atleast 55 laps when doing the track. She is active at home by walking several times a day. Laurie Lamb has completed 12 sessions of CR. She is doing well in rehab and enjoys walking the track. Will continue to monitor and progress as able.   Expected Outcomes Short: Use RPE daily to regulate intensity.  Long: Follow program prescription in THR. Short: continue to attend rehab   long: exercise outside of rehab  Short:  Continue to increase level on Nustep  Long:  Continue to walk/exercise at home Short:  Continue to increase level on Nustep  Long:  Continue to walk/exercise at home Short: continue to attend rehab  long: continue to exerise      Nutrition & Weight - Outcomes:  Pre Biometrics - 02/02/24 0937       Pre Biometrics   Height 5' (1.524 m)    Weight 57.6 kg    Waist Circumference 34 inches    Hip Circumference 35.5 inches    Waist to Hip Ratio  0.96 %    BMI (Calculated) 24.8    Grip Strength 18 kg    Single Leg Stand 30 seconds          Post Biometrics - 03/14/24 0940        Post  Biometrics   Height 5' (1.524 m)    Weight 56.6 kg    Waist Circumference 34 inches    Hip Circumference 35 inches    Waist to Hip Ratio 0.97 %    BMI (Calculated) 24.37    Grip Strength 18 kg    Single Leg Stand 30 seconds          Nutrition:   Nutrition Discharge:   Education Questionnaire Score:  Knowledge Questionnaire Score - 02/02/24 0952       Knowledge Questionnaire Score   Pre Score 22/26          Goals reviewed with patient; copy given to patient.    [1]  Social History Tobacco Use  Smoking Status Never  Smokeless Tobacco Never

## 2024-03-19 ENCOUNTER — Encounter (HOSPITAL_COMMUNITY)

## 2024-03-21 ENCOUNTER — Encounter (HOSPITAL_COMMUNITY)

## 2024-03-22 ENCOUNTER — Telehealth: Payer: Self-pay | Admitting: Cardiology

## 2024-03-22 DIAGNOSIS — I2510 Atherosclerotic heart disease of native coronary artery without angina pectoris: Secondary | ICD-10-CM

## 2024-03-22 NOTE — Telephone Encounter (Signed)
 Pt states Repatha  & Brilinta  are expensive at the pharmacy and wants to know if she can get the pharmacy grant for these medicaations as well. Please advise.

## 2024-03-23 LAB — BASIC METABOLIC PANEL WITH GFR
BUN/Creatinine Ratio: 16 (ref 12–28)
BUN: 18 mg/dL (ref 8–27)
CO2: 21 mmol/L (ref 20–29)
Calcium: 9.6 mg/dL (ref 8.7–10.3)
Chloride: 104 mmol/L (ref 96–106)
Creatinine, Ser: 1.11 mg/dL — ABNORMAL HIGH (ref 0.57–1.00)
Glucose: 162 mg/dL — ABNORMAL HIGH (ref 70–99)
Potassium: 4.1 mmol/L (ref 3.5–5.2)
Sodium: 142 mmol/L (ref 134–144)
eGFR: 53 mL/min/1.73 — ABNORMAL LOW

## 2024-03-23 NOTE — Telephone Encounter (Signed)
 Pt would like a c/b regarding the two medications wanting to know what to do due to it being expensive. Please advice

## 2024-03-26 ENCOUNTER — Encounter (HOSPITAL_COMMUNITY)

## 2024-03-26 ENCOUNTER — Ambulatory Visit: Payer: Self-pay | Admitting: Physician Assistant

## 2024-03-26 ENCOUNTER — Other Ambulatory Visit (HOSPITAL_COMMUNITY): Payer: Self-pay

## 2024-03-27 ENCOUNTER — Other Ambulatory Visit (HOSPITAL_COMMUNITY): Payer: Self-pay

## 2024-03-27 MED ORDER — REPATHA SURECLICK 140 MG/ML ~~LOC~~ SOAJ: 140.0000 mg | SUBCUTANEOUS | 3 refills | Status: DC

## 2024-03-28 ENCOUNTER — Encounter (HOSPITAL_COMMUNITY)

## 2024-04-02 ENCOUNTER — Encounter (HOSPITAL_COMMUNITY)

## 2024-04-04 ENCOUNTER — Encounter (HOSPITAL_COMMUNITY)

## 2024-04-05 ENCOUNTER — Other Ambulatory Visit (HOSPITAL_COMMUNITY): Payer: Self-pay

## 2024-04-05 ENCOUNTER — Telehealth: Payer: Self-pay | Admitting: Pharmacist

## 2024-04-05 DIAGNOSIS — E785 Hyperlipidemia, unspecified: Secondary | ICD-10-CM

## 2024-04-05 MED ORDER — SACUBITRIL-VALSARTAN 24-26 MG PO TABS: 1.0000 | ORAL_TABLET | Freq: Two times a day (BID) | ORAL | 11 refills | Status: AC

## 2024-04-05 MED ORDER — REPATHA SURECLICK 140 MG/ML ~~LOC~~ SOAJ
1.0000 mL | SUBCUTANEOUS | 3 refills | Status: AC
  Filled 2024-04-05: qty 6, 84d supply, fill #0
  Filled ????-??-??: fill #0

## 2024-04-05 NOTE — Telephone Encounter (Signed)
 Patient also reporting Dizziness/lightheaded when she stands up since being on Entresto . BP has been low 80's-low 100's systolic. Decrease entresto  to 24/26mg  BID. Pt to let me know if SBP does not improve to >100  83 116 93/55

## 2024-04-05 NOTE — Addendum Note (Signed)
 Addended by: Charnel Giles D on: 04/05/2024 11:05 AM   Modules accepted: Orders

## 2024-04-05 NOTE — Telephone Encounter (Signed)
 Reminded patient to get repeat lipid panel She said her pharmacy didn't bill grant. Will send to [facility] for delivery.

## 2024-04-09 ENCOUNTER — Encounter (HOSPITAL_COMMUNITY)

## 2024-04-10 ENCOUNTER — Ambulatory Visit: Admitting: Family Medicine

## 2024-04-11 ENCOUNTER — Encounter (HOSPITAL_COMMUNITY)

## 2024-04-16 ENCOUNTER — Encounter (HOSPITAL_COMMUNITY)

## 2024-04-18 ENCOUNTER — Encounter (HOSPITAL_COMMUNITY)

## 2024-04-23 ENCOUNTER — Encounter (HOSPITAL_COMMUNITY)

## 2024-04-23 ENCOUNTER — Ambulatory Visit (HOSPITAL_COMMUNITY)

## 2024-04-25 ENCOUNTER — Encounter (HOSPITAL_COMMUNITY)

## 2024-04-30 ENCOUNTER — Encounter (HOSPITAL_COMMUNITY)

## 2024-05-02 ENCOUNTER — Encounter (HOSPITAL_COMMUNITY)

## 2024-05-04 ENCOUNTER — Ambulatory Visit: Admitting: Cardiovascular Disease

## 2024-05-07 ENCOUNTER — Encounter (HOSPITAL_COMMUNITY)

## 2024-05-09 ENCOUNTER — Encounter (HOSPITAL_COMMUNITY)

## 2024-05-14 ENCOUNTER — Encounter (HOSPITAL_COMMUNITY)

## 2024-05-16 ENCOUNTER — Encounter (HOSPITAL_COMMUNITY)

## 2024-05-21 ENCOUNTER — Encounter (HOSPITAL_COMMUNITY)

## 2024-05-23 ENCOUNTER — Encounter (HOSPITAL_COMMUNITY)

## 2024-05-28 ENCOUNTER — Encounter (HOSPITAL_COMMUNITY)

## 2024-05-30 ENCOUNTER — Encounter (HOSPITAL_COMMUNITY)

## 2024-06-04 ENCOUNTER — Encounter (HOSPITAL_COMMUNITY)
